# Patient Record
Sex: Male | Born: 1960 | Race: Black or African American | Hispanic: No | Smoking: Never smoker
Health system: Southern US, Community
[De-identification: ages and names within clinical notes are randomized; demographics above are authoritative.]

## PROBLEM LIST (undated history)

## (undated) DIAGNOSIS — E119 Type 2 diabetes mellitus without complications: Secondary | ICD-10-CM

## (undated) DIAGNOSIS — Z87442 Personal history of urinary calculi: Secondary | ICD-10-CM

## (undated) DIAGNOSIS — J189 Pneumonia, unspecified organism: Secondary | ICD-10-CM

## (undated) DIAGNOSIS — I10 Essential (primary) hypertension: Secondary | ICD-10-CM

## (undated) HISTORY — PX: CERVICAL DISC ARTHROPLASTY: SHX587

---

## 1999-12-17 ENCOUNTER — Emergency Department (HOSPITAL_COMMUNITY): Admission: EM | Admit: 1999-12-17 | Discharge: 1999-12-17 | Payer: Self-pay | Admitting: Emergency Medicine

## 1999-12-17 ENCOUNTER — Encounter: Payer: Self-pay | Admitting: Emergency Medicine

## 2000-09-05 ENCOUNTER — Other Ambulatory Visit: Admission: RE | Admit: 2000-09-05 | Discharge: 2000-09-05 | Payer: Self-pay | Admitting: Urology

## 2002-01-13 ENCOUNTER — Emergency Department (HOSPITAL_COMMUNITY): Admission: EM | Admit: 2002-01-13 | Discharge: 2002-01-13 | Payer: Self-pay | Admitting: Emergency Medicine

## 2002-01-13 ENCOUNTER — Encounter: Payer: Self-pay | Admitting: Emergency Medicine

## 2003-04-06 ENCOUNTER — Emergency Department (HOSPITAL_COMMUNITY): Admission: EM | Admit: 2003-04-06 | Discharge: 2003-04-06 | Payer: Self-pay | Admitting: Emergency Medicine

## 2003-07-22 ENCOUNTER — Ambulatory Visit (HOSPITAL_COMMUNITY): Admission: RE | Admit: 2003-07-22 | Discharge: 2003-07-22 | Payer: Self-pay | Admitting: Gastroenterology

## 2015-10-05 ENCOUNTER — Other Ambulatory Visit: Payer: Self-pay | Admitting: Internal Medicine

## 2015-10-05 ENCOUNTER — Ambulatory Visit
Admission: RE | Admit: 2015-10-05 | Discharge: 2015-10-05 | Disposition: A | Discharge status: Home / Self Care | Payer: 59 | Source: Ambulatory Visit | Attending: Internal Medicine | Admitting: Internal Medicine

## 2015-10-05 DIAGNOSIS — R51 Headache: Principal | ICD-10-CM

## 2015-10-05 DIAGNOSIS — R110 Nausea: Secondary | ICD-10-CM

## 2015-10-05 DIAGNOSIS — R519 Headache, unspecified: Secondary | ICD-10-CM

## 2016-03-22 DIAGNOSIS — E1129 Type 2 diabetes mellitus with other diabetic kidney complication: Secondary | ICD-10-CM | POA: Diagnosis not present

## 2016-03-22 DIAGNOSIS — E7800 Pure hypercholesterolemia, unspecified: Secondary | ICD-10-CM | POA: Diagnosis not present

## 2016-03-22 DIAGNOSIS — N182 Chronic kidney disease, stage 2 (mild): Secondary | ICD-10-CM | POA: Diagnosis not present

## 2016-09-26 DIAGNOSIS — Z0000 Encounter for general adult medical examination without abnormal findings: Secondary | ICD-10-CM | POA: Diagnosis not present

## 2016-10-01 DIAGNOSIS — Z1389 Encounter for screening for other disorder: Secondary | ICD-10-CM | POA: Diagnosis not present

## 2016-10-01 DIAGNOSIS — N182 Chronic kidney disease, stage 2 (mild): Secondary | ICD-10-CM | POA: Diagnosis not present

## 2016-10-01 DIAGNOSIS — Z0000 Encounter for general adult medical examination without abnormal findings: Secondary | ICD-10-CM | POA: Diagnosis not present

## 2016-10-01 DIAGNOSIS — Z23 Encounter for immunization: Secondary | ICD-10-CM | POA: Diagnosis not present

## 2016-10-01 DIAGNOSIS — R808 Other proteinuria: Secondary | ICD-10-CM | POA: Diagnosis not present

## 2016-10-01 DIAGNOSIS — E1129 Type 2 diabetes mellitus with other diabetic kidney complication: Secondary | ICD-10-CM | POA: Diagnosis not present

## 2016-10-04 DIAGNOSIS — Z1212 Encounter for screening for malignant neoplasm of rectum: Secondary | ICD-10-CM | POA: Diagnosis not present

## 2017-08-05 DIAGNOSIS — H524 Presbyopia: Secondary | ICD-10-CM | POA: Diagnosis not present

## 2017-08-05 DIAGNOSIS — H5213 Myopia, bilateral: Secondary | ICD-10-CM | POA: Diagnosis not present

## 2017-09-30 DIAGNOSIS — R82998 Other abnormal findings in urine: Secondary | ICD-10-CM | POA: Diagnosis not present

## 2017-09-30 DIAGNOSIS — Z0000 Encounter for general adult medical examination without abnormal findings: Secondary | ICD-10-CM | POA: Diagnosis not present

## 2017-09-30 DIAGNOSIS — Z125 Encounter for screening for malignant neoplasm of prostate: Secondary | ICD-10-CM | POA: Diagnosis not present

## 2017-10-07 DIAGNOSIS — R808 Other proteinuria: Secondary | ICD-10-CM | POA: Diagnosis not present

## 2017-10-07 DIAGNOSIS — E1129 Type 2 diabetes mellitus with other diabetic kidney complication: Secondary | ICD-10-CM | POA: Diagnosis not present

## 2017-10-07 DIAGNOSIS — N182 Chronic kidney disease, stage 2 (mild): Secondary | ICD-10-CM | POA: Diagnosis not present

## 2017-10-07 DIAGNOSIS — Z1389 Encounter for screening for other disorder: Secondary | ICD-10-CM | POA: Diagnosis not present

## 2017-10-07 DIAGNOSIS — Z0000 Encounter for general adult medical examination without abnormal findings: Secondary | ICD-10-CM | POA: Diagnosis not present

## 2017-12-16 DIAGNOSIS — M542 Cervicalgia: Secondary | ICD-10-CM | POA: Diagnosis not present

## 2017-12-16 DIAGNOSIS — M549 Dorsalgia, unspecified: Secondary | ICD-10-CM | POA: Diagnosis not present

## 2017-12-16 DIAGNOSIS — R079 Chest pain, unspecified: Secondary | ICD-10-CM | POA: Diagnosis not present

## 2017-12-20 DIAGNOSIS — M549 Dorsalgia, unspecified: Secondary | ICD-10-CM | POA: Diagnosis not present

## 2017-12-20 DIAGNOSIS — M542 Cervicalgia: Secondary | ICD-10-CM | POA: Diagnosis not present

## 2017-12-26 DIAGNOSIS — M9902 Segmental and somatic dysfunction of thoracic region: Secondary | ICD-10-CM | POA: Diagnosis not present

## 2017-12-26 DIAGNOSIS — M62830 Muscle spasm of back: Secondary | ICD-10-CM | POA: Diagnosis not present

## 2017-12-26 DIAGNOSIS — M9901 Segmental and somatic dysfunction of cervical region: Secondary | ICD-10-CM | POA: Diagnosis not present

## 2018-01-03 DIAGNOSIS — M542 Cervicalgia: Secondary | ICD-10-CM | POA: Diagnosis not present

## 2018-01-12 ENCOUNTER — Other Ambulatory Visit: Payer: Self-pay | Admitting: Orthopedic Surgery

## 2018-01-12 DIAGNOSIS — M542 Cervicalgia: Secondary | ICD-10-CM

## 2018-01-16 ENCOUNTER — Ambulatory Visit
Admission: RE | Admit: 2018-01-16 | Discharge: 2018-01-16 | Disposition: A | Discharge status: Home / Self Care | Payer: 59 | Source: Ambulatory Visit | Attending: Orthopedic Surgery | Admitting: Orthopedic Surgery

## 2018-01-16 DIAGNOSIS — M4802 Spinal stenosis, cervical region: Secondary | ICD-10-CM | POA: Diagnosis not present

## 2018-01-16 DIAGNOSIS — M542 Cervicalgia: Secondary | ICD-10-CM

## 2018-01-23 DIAGNOSIS — M5010 Cervical disc disorder with radiculopathy, unspecified cervical region: Secondary | ICD-10-CM | POA: Diagnosis not present

## 2018-01-23 DIAGNOSIS — M5020 Other cervical disc displacement, unspecified cervical region: Secondary | ICD-10-CM | POA: Diagnosis not present

## 2018-02-13 DIAGNOSIS — M5010 Cervical disc disorder with radiculopathy, unspecified cervical region: Secondary | ICD-10-CM | POA: Diagnosis not present

## 2018-02-20 DIAGNOSIS — M5013 Cervical disc disorder with radiculopathy, cervicothoracic region: Secondary | ICD-10-CM | POA: Diagnosis not present

## 2018-04-06 DIAGNOSIS — Z4889 Encounter for other specified surgical aftercare: Secondary | ICD-10-CM | POA: Diagnosis not present

## 2018-04-07 DIAGNOSIS — R809 Proteinuria, unspecified: Secondary | ICD-10-CM | POA: Diagnosis not present

## 2018-04-07 DIAGNOSIS — I10 Essential (primary) hypertension: Secondary | ICD-10-CM | POA: Diagnosis not present

## 2018-04-07 DIAGNOSIS — E1129 Type 2 diabetes mellitus with other diabetic kidney complication: Secondary | ICD-10-CM | POA: Diagnosis not present

## 2018-04-07 DIAGNOSIS — E7800 Pure hypercholesterolemia, unspecified: Secondary | ICD-10-CM | POA: Diagnosis not present

## 2018-04-07 DIAGNOSIS — N182 Chronic kidney disease, stage 2 (mild): Secondary | ICD-10-CM | POA: Diagnosis not present

## 2018-04-13 DIAGNOSIS — M542 Cervicalgia: Secondary | ICD-10-CM | POA: Diagnosis not present

## 2018-05-19 DIAGNOSIS — Z4889 Encounter for other specified surgical aftercare: Secondary | ICD-10-CM | POA: Diagnosis not present

## 2018-07-31 ENCOUNTER — Encounter (HOSPITAL_COMMUNITY): Payer: Self-pay

## 2018-07-31 ENCOUNTER — Ambulatory Visit (HOSPITAL_COMMUNITY)
Admission: EM | Admit: 2018-07-31 | Discharge: 2018-07-31 | Disposition: A | Discharge status: Home / Self Care | Payer: 59 | Attending: Family Medicine | Admitting: Family Medicine

## 2018-07-31 ENCOUNTER — Other Ambulatory Visit: Payer: Self-pay

## 2018-07-31 DIAGNOSIS — L723 Sebaceous cyst: Secondary | ICD-10-CM

## 2018-07-31 HISTORY — DX: Type 2 diabetes mellitus without complications: E11.9

## 2018-07-31 HISTORY — DX: Essential (primary) hypertension: I10

## 2018-07-31 MED ORDER — CEPHALEXIN 500 MG PO CAPS: 500.0000 mg | ORAL_CAPSULE | Freq: Two times a day (BID) | ORAL | 0 refills | Status: DC

## 2018-07-31 NOTE — ED Triage Notes (Signed)
Pt presents with the begininng stages of an abscess on the outside of the right ear.

## 2018-07-31 NOTE — Discharge Instructions (Signed)
Please place a warm wash cloth over the area Please take the antibiotics if the area is still painful or becomes red  Please take ibuprofen for pain  Please follow up if your symptoms fail to improve  You can consider seeing an ENT doctor to remove the cyst.

## 2018-07-31 NOTE — ED Provider Notes (Signed)
[facility]    CSN: [id number] Arrival date & time: 07/31/18  1244     History   Chief Complaint Chief Complaint  Patient presents with  . Abscess    HPI Jeremy Moore is a 58 y.o. male.   He is presenting with right ear pain.  He has had this pain before.  He is having some fullness over the preauricular area.  He has had symptoms suggestive of an abscess previously.  He has pain to the touch.  It is significantly painful and constant.  No loss of hearing.  No trauma.  No fever or chills.  HPI  Past Medical History:  Diagnosis Date  . Diabetes mellitus without complication (HCC)   . Hypertension     There are no active problems to display for this patient.   Past Surgical History:  Procedure Laterality Date  . CERVICAL DISC ARTHROPLASTY         Home Medications    Prior to Admission medications   Medication Sig Start Date End Date Taking? Authorizing Provider  cephALEXin (KEFLEX) 500 MG capsule Take 1 capsule (500 mg total) by mouth 2 (two) times daily. 07/31/18   Myra RudeSchmitz, Jeremy E, MD    Family History Family History  Family history unknown: Yes    Social History Social History   Tobacco Use  . Smoking status: Never Smoker  Substance Use Topics  . Alcohol use: Not on file  . Drug use: Not on file     Allergies   Patient has no known allergies.   Review of Systems Review of Systems  Constitutional: Negative for fever.  HENT: Negative for congestion.   Respiratory: Negative for cough.   Cardiovascular: Negative for chest pain.  Gastrointestinal: Negative for abdominal pain.  Musculoskeletal: Negative for neck pain.  Skin: Negative for color change.  Neurological: Negative for weakness.  Hematological: Negative for adenopathy.     Physical Exam Triage Vital Signs ED Triage Vitals [07/31/18 1314]  Enc Vitals Group     BP (!) 136/91     Pulse Rate 69     Resp 18     Temp 98.4 F (36.9 C)     Temp Source Oral     SpO2 97  %     Weight      Height      Head Circumference      Peak Flow      Pain Score 5     Pain Loc      Pain Edu?      Excl. in GC?    No data found.  Updated Vital Signs BP (!) 136/91 (BP Location: Right Arm)   Pulse 69   Temp 98.4 F (36.9 C) (Oral)   Resp 18   SpO2 97%   Visual Acuity Right Eye Distance:   Left Eye Distance:   Bilateral Distance:    Right Eye Near:   Left Eye Near:    Bilateral Near:     Physical Exam Gen: NAD, alert, cooperative with exam, well-appearing ENT: normal lips, normal nasal mucosa,  Eye: normal EOM, normal conjunctiva and lids CV:  no edema, +2 pedal pulses   Resp: no accessory muscle use, non-labored,   Skin: Area of induration and fluctuance occurring over the preauricular area on the right ear.  Swelling of this area.  Significantly tender to the touch.  Induration  Neuro: normal tone, normal sensation to touch Psych:  normal insight, alert and oriented MSK:  Normal gait, normal strength  Incision and Drainage Procedure Note:  The affected area was cleaned and draped in a sterile fashion. Anesthesia was achieved using 3 mL of 2% Lidocaine without epinephrine injected around the wound area using a 25-guage 1.5 inch needle.  An 18-gauge inch and a half needle was inserted into the area with a 10 cc syringe.  Aspiration was achieved.  Expression of further material was achieved. The patient tolerated the procedure well. No complications were encountered.    UC Treatments / Results  Labs (all labs ordered are listed, but only abnormal results are displayed) Labs Reviewed - No data to display  EKG   Radiology No results found.  Procedures Procedures (including critical care time)  Medications Ordered in UC Medications - No data to display  Initial Impression / Assessment and Plan / UC Course  I have reviewed the triage vital signs and the nursing notes.  Pertinent labs & imaging results that were available during my care of  the patient were reviewed by me and considered in my medical decision making (see chart for details).     Jeremy Moore is a 58 year old male that is presenting with a sebaceous cyst over the right preauricular ear.  An incision and drainage was completed today.  Expression of pus was achieved.  He was provided Keflex if the area does not improve.  He was counseled on supportive care.  He was given indications to return to follow-up.  Final Clinical Impressions(s) / UC Diagnoses   Final diagnoses:  Sebaceous cyst     Discharge Instructions     Please place a warm wash cloth over the area Please take the antibiotics if the area is still painful or becomes red  Please take ibuprofen for pain  Please follow up if your symptoms fail to improve  You can consider seeing an ENT doctor to remove the cyst.     ED Prescriptions    Medication Sig Dispense Auth. Provider   cephALEXin (KEFLEX) 500 MG capsule Take 1 capsule (500 mg total) by mouth 2 (two) times daily. 14 capsule Rosemarie Ax, MD     Controlled Substance Prescriptions [place] Controlled Substance Registry consulted? Not Applicable   Rosemarie Ax, MD 07/31/18 [phone]

## 2019-12-13 ENCOUNTER — Other Ambulatory Visit: Payer: Self-pay | Admitting: Physician Assistant

## 2019-12-13 DIAGNOSIS — R1032 Left lower quadrant pain: Secondary | ICD-10-CM

## 2019-12-13 DIAGNOSIS — K5730 Diverticulosis of large intestine without perforation or abscess without bleeding: Secondary | ICD-10-CM

## 2019-12-31 ENCOUNTER — Ambulatory Visit
Admission: RE | Admit: 2019-12-31 | Discharge: 2019-12-31 | Disposition: A | Discharge status: Home / Self Care | Payer: 59 | Source: Ambulatory Visit | Attending: Physician Assistant | Admitting: Physician Assistant

## 2019-12-31 DIAGNOSIS — R1032 Left lower quadrant pain: Secondary | ICD-10-CM

## 2019-12-31 DIAGNOSIS — K5730 Diverticulosis of large intestine without perforation or abscess without bleeding: Secondary | ICD-10-CM

## 2019-12-31 MED ORDER — IOPAMIDOL (ISOVUE-300) INJECTION 61%
100.0000 mL | Freq: Once | INTRAVENOUS | Status: AC | PRN
  Administered 2019-12-31: 100 mL via INTRAVENOUS

## 2020-01-03 ENCOUNTER — Other Ambulatory Visit: Payer: Self-pay | Admitting: Physician Assistant

## 2020-01-03 DIAGNOSIS — N289 Disorder of kidney and ureter, unspecified: Secondary | ICD-10-CM

## 2020-01-28 ENCOUNTER — Other Ambulatory Visit: Payer: 59

## 2020-03-01 ENCOUNTER — Other Ambulatory Visit: Payer: Self-pay | Admitting: Urology

## 2020-03-02 ENCOUNTER — Other Ambulatory Visit (HOSPITAL_COMMUNITY): Payer: Self-pay | Admitting: Urology

## 2020-03-02 DIAGNOSIS — N200 Calculus of kidney: Secondary | ICD-10-CM

## 2020-03-20 NOTE — Progress Notes (Signed)
DUE TO COVID-19 ONLY ONE VISITOR IS ALLOWED TO COME WITH YOU AND STAY IN THE WAITING ROOM ONLY DURING PRE OP AND PROCEDURE DAY OF SURGERY. THE 1 VISITOR  MAY VISIT WITH YOU AFTER SURGERY IN YOUR PRIVATE ROOM DURING VISITING HOURS ONLY!  YOU NEED TO HAVE A COVID 19 TEST ON_3/08/2020 ______ @_______ , THIS TEST MUST BE DONE BEFORE SURGERY,  COVID TESTING SITE [address] [place] , IT IS ON THE RIGHT GOING OUT [address] APPROXIMATELY  2 MINUTES PAST [employer] ON THE RIGHT. ONCE YOUR COVID TEST IS COMPLETED,  PLEASE BEGIN THE QUARANTINE INSTRUCTIONS AS OUTLINED IN YOUR HANDOUT.                DANUEL FELICETTI  03/20/2020   Your procedure is scheduled on: 03/31/2020    Report to [facility] Main  Entrance   Report to admitting at   0800 AM     Call this number if you have problems the morning of surgery [phone]    Remember: Do not eat food , candy gum or mints :After Midnight. You may have clear liquids from midnight until   0630am     CLEAR LIQUID DIET   Foods Allowed                                                                       Coffee and tea, regular and decaf                              Plain Jell-O any favor except red or purple                                            Fruit ices (not with fruit pulp)                                      Iced Popsicles                                     Carbonated beverages, regular and diet                                    Cranberry, grape and apple juices Sports drinks like Gatorade Lightly seasoned clear broth or consume(fat free) Sugar, honey syrup   _____________________________________________________________________    BRUSH YOUR TEETH MORNING OF SURGERY AND RINSE YOUR MOUTH OUT, NO CHEWING GUM CANDY OR MINTS.     Take these medicines the morning of surgery with A SIP OF WATER: none  NO jardiance day before surgery  DO NOT TAKE ANY DIABETIC MEDICATIONS DAY OF YOUR  SURGERY                               You may  not have any metal on your body including hair pins and              piercings  Do not wear jewelry, make-up, lotions, powders or perfumes, deodorant             Do not wear nail polish on your fingernails.  Do not shave  48 hours prior to surgery.              Men may shave face and neck.   Do not bring valuables to the hospital. [place].  Contacts, dentures or bridgework may not be worn into surgery.  Leave suitcase in the car. After surgery it may be brought to your room.     Patients discharged the day of surgery will not be allowed to drive home. IF YOU ARE HAVING SURGERY AND GOING HOME THE SAME DAY, YOU MUST HAVE AN ADULT TO DRIVE YOU HOME AND BE WITH YOU FOR 24 HOURS. YOU MAY GO HOME BY TAXI OR UBER OR ORTHERWISE, BUT AN ADULT MUST ACCOMPANY YOU HOME AND STAY WITH YOU FOR 24 HOURS.  Name and phone number of your driver:  Special Instructions: N/A              Please read over the following fact sheets you were given: _____________________________________________________________________  [facility] - Preparing for Surgery Before surgery, you can play an important role.  Because skin is not sterile, your skin needs to be as free of germs as possible.  You can reduce the number of germs on your skin by washing with CHG (chlorahexidine gluconate) soap before surgery.  CHG is an antiseptic cleaner which kills germs and bonds with the skin to continue killing germs even after washing. Please DO NOT use if you have an allergy to CHG or antibacterial soaps.  If your skin becomes reddened/irritated stop using the CHG and inform your nurse when you arrive at Short Stay. Do not shave (including legs and underarms) for at least 48 hours prior to the first CHG shower.  You may shave your face/neck. Please follow these instructions carefully:  1.  Shower with CHG Soap the night before surgery and the   morning of Surgery.  2.  If you choose to wash your hair, wash your hair first as usual with your  normal  shampoo.  3.  After you shampoo, rinse your hair and body thoroughly to remove the  shampoo.                           4.  Use CHG as you would any other liquid soap.  You can apply chg directly  to the skin and wash                       Gently with a scrungie or clean washcloth.  5.  Apply the CHG Soap to your body ONLY FROM THE NECK DOWN.   Do not use on face/ open                           Wound or open sores. Avoid contact with eyes, ears mouth and genitals (private parts).  Wash face,  Genitals (private parts) with your normal soap.             6.  Wash thoroughly, paying special attention to the area where your surgery  will be performed.  7.  Thoroughly rinse your body with warm water from the neck down.  8.  DO NOT shower/wash with your normal soap after using and rinsing off  the CHG Soap.                9.  Pat yourself dry with a clean towel.            10.  Wear clean pajamas.            11.  Place clean sheets on your bed the night of your first shower and do not  sleep with pets. Day of Surgery : Do not apply any lotions/deodorants the morning of surgery.  Please wear clean clothes to the hospital/surgery center.  FAILURE TO FOLLOW THESE INSTRUCTIONS MAY RESULT IN THE CANCELLATION OF YOUR SURGERY PATIENT SIGNATURE_________________________________  NURSE SIGNATURE__________________________________  ________________________________________________________________________

## 2020-03-22 ENCOUNTER — Encounter (HOSPITAL_COMMUNITY)
Admission: RE | Admit: 2020-03-22 | Discharge: 2020-03-22 | Disposition: A | Discharge status: Home / Self Care | Payer: 59 | Source: Ambulatory Visit | Attending: Urology | Admitting: Urology

## 2020-03-22 ENCOUNTER — Other Ambulatory Visit: Payer: Self-pay

## 2020-03-22 ENCOUNTER — Encounter (HOSPITAL_COMMUNITY): Payer: Self-pay

## 2020-03-22 DIAGNOSIS — Z01818 Encounter for other preprocedural examination: Secondary | ICD-10-CM | POA: Insufficient documentation

## 2020-03-22 HISTORY — DX: Personal history of urinary calculi: Z87.442

## 2020-03-22 HISTORY — DX: Pneumonia, unspecified organism: J18.9

## 2020-03-22 LAB — BASIC METABOLIC PANEL
Anion gap: 11 (ref 5–15)
BUN: 34 mg/dL — ABNORMAL HIGH (ref 8–23)
CO2: 25 mmol/L (ref 22–32)
Calcium: 10.3 mg/dL (ref 8.9–10.3)
Chloride: 102 mmol/L (ref 98–111)
Creatinine, Ser: 0.99 mg/dL (ref 0.61–1.24)
GFR, Estimated: >60 mL/min (ref >60)
Glucose, Bld: 216 mg/dL — ABNORMAL HIGH (ref 70–99)
Potassium: 4.5 mmol/L (ref 3.5–5.1)
Sodium: 138 mmol/L (ref 135–145)

## 2020-03-22 LAB — CBC
HCT: 41.6 % (ref 39.0–52.0)
Hemoglobin: 13.3 g/dL (ref 13.0–17.0)
MCH: 30 pg (ref 26.0–34.0)
MCHC: 32 g/dL (ref 30.0–36.0)
MCV: 93.7 fL (ref 80.0–100.0)
Platelets: 272 10*3/uL (ref 150–400)
RBC: 4.44 MIL/uL (ref 4.22–5.81)
RDW: 13.1 % (ref 11.5–15.5)
WBC: 6 10*3/uL (ref 4.0–10.5)
nRBC: 0 % (ref 0.0–0.2)

## 2020-03-22 LAB — HEMOGLOBIN A1C
Hgb A1c MFr Bld: 7.2 % — ABNORMAL HIGH (ref 4.8–5.6)
Mean Plasma Glucose: 159.94 mg/dL

## 2020-03-22 LAB — GLUCOSE, CAPILLARY: Glucose-Capillary: 204 mg/dL — ABNORMAL HIGH (ref 70–99)

## 2020-03-22 NOTE — Progress Notes (Signed)
HGBA1c-done 03/22/20-

## 2020-03-22 NOTE — Progress Notes (Signed)
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

## 2020-03-23 NOTE — Progress Notes (Signed)
hgba1c-7.2 on 03/22/20.

## 2020-03-28 ENCOUNTER — Other Ambulatory Visit (HOSPITAL_COMMUNITY)
Admission: RE | Admit: 2020-03-28 | Discharge: 2020-03-28 | Disposition: A | Discharge status: Home / Self Care | Payer: 59 | Source: Ambulatory Visit | Attending: Urology | Admitting: Urology

## 2020-03-28 DIAGNOSIS — Z01812 Encounter for preprocedural laboratory examination: Secondary | ICD-10-CM | POA: Insufficient documentation

## 2020-03-28 DIAGNOSIS — Z20822 Contact with and (suspected) exposure to covid-19: Secondary | ICD-10-CM | POA: Insufficient documentation

## 2020-03-28 LAB — SARS CORONAVIRUS 2 (TAT 6-24 HRS): SARS Coronavirus 2: NEGATIVE

## 2020-03-30 ENCOUNTER — Other Ambulatory Visit: Payer: Self-pay | Admitting: Radiology

## 2020-03-31 ENCOUNTER — Other Ambulatory Visit: Payer: Self-pay

## 2020-03-31 ENCOUNTER — Ambulatory Visit (HOSPITAL_COMMUNITY): Payer: 59 | Admitting: Anesthesiology

## 2020-03-31 ENCOUNTER — Encounter (HOSPITAL_COMMUNITY): Payer: Self-pay | Admitting: Urology

## 2020-03-31 ENCOUNTER — Encounter (HOSPITAL_COMMUNITY)
Admission: RE | Disposition: A | Discharge status: Home / Self Care | Payer: Self-pay | Source: Ambulatory Visit | Attending: Urology

## 2020-03-31 ENCOUNTER — Ambulatory Visit (HOSPITAL_COMMUNITY)
Admission: RE | Admit: 2020-03-31 | Discharge: 2020-03-31 | Disposition: A | Discharge status: Home / Self Care | Payer: 59 | Source: Ambulatory Visit | Attending: Urology | Admitting: Urology

## 2020-03-31 ENCOUNTER — Observation Stay (HOSPITAL_COMMUNITY)
Admission: RE | Admit: 2020-03-31 | Discharge: 2020-04-02 | Disposition: A | Discharge status: Home / Self Care | Payer: 59 | Source: Ambulatory Visit | Attending: Urology | Admitting: Urology

## 2020-03-31 ENCOUNTER — Ambulatory Visit (HOSPITAL_COMMUNITY): Payer: 59

## 2020-03-31 DIAGNOSIS — Z7984 Long term (current) use of oral hypoglycemic drugs: Secondary | ICD-10-CM | POA: Insufficient documentation

## 2020-03-31 DIAGNOSIS — E119 Type 2 diabetes mellitus without complications: Secondary | ICD-10-CM | POA: Diagnosis not present

## 2020-03-31 DIAGNOSIS — Z79899 Other long term (current) drug therapy: Secondary | ICD-10-CM | POA: Insufficient documentation

## 2020-03-31 DIAGNOSIS — N200 Calculus of kidney: Secondary | ICD-10-CM | POA: Diagnosis not present

## 2020-03-31 DIAGNOSIS — I10 Essential (primary) hypertension: Secondary | ICD-10-CM | POA: Diagnosis not present

## 2020-03-31 HISTORY — PX: IR URETERAL STENT LEFT NEW ACCESS W/O SEP NEPHROSTOMY CATH: IMG6075

## 2020-03-31 HISTORY — PX: NEPHROLITHOTOMY: SHX5134

## 2020-03-31 LAB — CBC WITH DIFFERENTIAL/PLATELET
Abs Immature Granulocytes: 0.03 10*3/uL (ref 0.00–0.07)
Basophils Absolute: 0 10*3/uL (ref 0.0–0.1)
Basophils Relative: 1 %
Eosinophils Absolute: 0.2 10*3/uL (ref 0.0–0.5)
Eosinophils Relative: 4 %
HCT: 44.2 % (ref 39.0–52.0)
Hemoglobin: 13.8 g/dL (ref 13.0–17.0)
Immature Granulocytes: 1 %
Lymphocytes Relative: 34 %
Lymphs Abs: 1.6 10*3/uL (ref 0.7–4.0)
MCH: 28.8 pg (ref 26.0–34.0)
MCHC: 31.2 g/dL (ref 30.0–36.0)
MCV: 92.1 fL (ref 80.0–100.0)
Monocytes Absolute: 0.4 10*3/uL (ref 0.1–1.0)
Monocytes Relative: 9 %
Neutro Abs: 2.4 10*3/uL (ref 1.7–7.7)
Neutrophils Relative %: 51 %
Platelets: 251 10*3/uL (ref 150–400)
RBC: 4.8 MIL/uL (ref 4.22–5.81)
RDW: 12.6 % (ref 11.5–15.5)
WBC: 4.7 10*3/uL (ref 4.0–10.5)
nRBC: 0 % (ref 0.0–0.2)

## 2020-03-31 LAB — GLUCOSE, CAPILLARY
Glucose-Capillary: 186 mg/dL — ABNORMAL HIGH (ref 70–99)
Glucose-Capillary: 120 mg/dL — ABNORMAL HIGH (ref 70–99)
Glucose-Capillary: 329 mg/dL — ABNORMAL HIGH (ref 70–99)

## 2020-03-31 LAB — CBC
HCT: 40.6 % (ref 39.0–52.0)
Hemoglobin: 12.7 g/dL — ABNORMAL LOW (ref 13.0–17.0)
MCH: 28.9 pg (ref 26.0–34.0)
MCHC: 31.3 g/dL (ref 30.0–36.0)
MCV: 92.5 fL (ref 80.0–100.0)
Platelets: 247 10*3/uL (ref 150–400)
RBC: 4.39 MIL/uL (ref 4.22–5.81)
RDW: 12.7 % (ref 11.5–15.5)
WBC: 8.7 10*3/uL (ref 4.0–10.5)
nRBC: 0 % (ref 0.0–0.2)

## 2020-03-31 LAB — BASIC METABOLIC PANEL
Anion gap: 9 (ref 5–15)
Anion gap: 11 (ref 5–15)
BUN: 23 mg/dL (ref 8–23)
BUN: 23 mg/dL (ref 8–23)
CO2: 25 mmol/L (ref 22–32)
CO2: 25 mmol/L (ref 22–32)
Calcium: 9.5 mg/dL (ref 8.9–10.3)
Calcium: 9.2 mg/dL (ref 8.9–10.3)
Chloride: 104 mmol/L (ref 98–111)
Chloride: 98 mmol/L (ref 98–111)
Creatinine, Ser: 1 mg/dL (ref 0.61–1.24)
Creatinine, Ser: 1.47 mg/dL — ABNORMAL HIGH (ref 0.61–1.24)
GFR, Estimated: >60 mL/min (ref >60)
GFR, Estimated: 54 mL/min — ABNORMAL LOW (ref >60)
Glucose, Bld: 190 mg/dL — ABNORMAL HIGH (ref 70–99)
Glucose, Bld: 323 mg/dL — ABNORMAL HIGH (ref 70–99)
Potassium: 4.1 mmol/L (ref 3.5–5.1)
Potassium: 4.7 mmol/L (ref 3.5–5.1)
Sodium: 138 mmol/L (ref 135–145)
Sodium: 134 mmol/L — ABNORMAL LOW (ref 135–145)

## 2020-03-31 LAB — ABO/RH: ABO/RH(D): O POS

## 2020-03-31 LAB — PROTIME-INR
INR: 1.1 (ref 0.8–1.2)
Prothrombin Time: 13.6 seconds (ref 11.4–15.2)

## 2020-03-31 SURGERY — NEPHROLITHOTOMY PERCUTANEOUS
Anesthesia: General | Laterality: Left

## 2020-03-31 MED ORDER — OXYCODONE HCL 5 MG PO TABS
5.0000 mg | ORAL_TABLET | ORAL | Status: DC | PRN
Start: 2020-03-31 — End: 2020-04-02
  Administered 2020-04-01 – 2020-04-02 (×4): 5 mg via ORAL
  Filled 2020-03-31 (×4): qty 1

## 2020-03-31 MED ORDER — IOHEXOL 300 MG/ML  SOLN
INTRAMUSCULAR | Status: DC | PRN
  Administered 2020-03-31: 10 mL

## 2020-03-31 MED ORDER — DEXAMETHASONE SODIUM PHOSPHATE 10 MG/ML IJ SOLN
INTRAMUSCULAR | Status: AC
  Filled 2020-03-31: qty 1

## 2020-03-31 MED ORDER — MIDAZOLAM HCL 2 MG/2ML IJ SOLN
INTRAMUSCULAR | Status: AC
  Filled 2020-03-31: qty 4

## 2020-03-31 MED ORDER — SODIUM CHLORIDE 0.9 % IR SOLN
Status: DC | PRN
  Administered 2020-03-31: 12000 mL

## 2020-03-31 MED ORDER — LIDOCAINE HCL 1 % IJ SOLN
INTRAMUSCULAR | Status: AC
  Filled 2020-03-31: qty 20

## 2020-03-31 MED ORDER — AMLODIPINE BESYLATE 5 MG PO TABS
5.0000 mg | ORAL_TABLET | Freq: Every day | ORAL | Status: DC
  Administered 2020-04-01 – 2020-04-02 (×2): 5 mg via ORAL
  Filled 2020-03-31 (×2): qty 1

## 2020-03-31 MED ORDER — CHLORHEXIDINE GLUCONATE 0.12 % MT SOLN
15.0000 mL | Freq: Once | OROMUCOSAL | Status: AC
  Administered 2020-03-31: 15 mL via OROMUCOSAL

## 2020-03-31 MED ORDER — LIDOCAINE HCL (PF) 1 % IJ SOLN
INTRAMUSCULAR | Status: AC | PRN
  Administered 2020-03-31: 10 mL

## 2020-03-31 MED ORDER — CHLORHEXIDINE GLUCONATE CLOTH 2 % EX PADS
6.0000 | MEDICATED_PAD | Freq: Every day | CUTANEOUS | Status: DC
  Administered 2020-04-01 – 2020-04-02 (×2): 6 via TOPICAL

## 2020-03-31 MED ORDER — FENTANYL CITRATE (PF) 100 MCG/2ML IJ SOLN
INTRAMUSCULAR | Status: AC
  Filled 2020-03-31: qty 2

## 2020-03-31 MED ORDER — ONDANSETRON HCL 4 MG/2ML IJ SOLN
INTRAMUSCULAR | Status: DC | PRN
  Administered 2020-03-31: 4 mg via INTRAVENOUS

## 2020-03-31 MED ORDER — ROCURONIUM BROMIDE 10 MG/ML (PF) SYRINGE
PREFILLED_SYRINGE | INTRAVENOUS | Status: AC
  Filled 2020-03-31: qty 10

## 2020-03-31 MED ORDER — MIDAZOLAM HCL 2 MG/2ML IJ SOLN
INTRAMUSCULAR | Status: AC | PRN
  Administered 2020-03-31: 2 mg via INTRAVENOUS
  Administered 2020-03-31 (×2): 1 mg via INTRAVENOUS

## 2020-03-31 MED ORDER — PROPOFOL 10 MG/ML IV BOLUS
INTRAVENOUS | Status: AC
  Filled 2020-03-31: qty 20

## 2020-03-31 MED ORDER — INSULIN ASPART 100 UNIT/ML ~~LOC~~ SOLN: 0.0000 [IU] | Freq: Three times a day (TID) | SUBCUTANEOUS | Status: DC

## 2020-03-31 MED ORDER — CEFAZOLIN SODIUM-DEXTROSE 2-4 GM/100ML-% IV SOLN: 2.0000 g | Freq: Once | INTRAVENOUS | Status: DC

## 2020-03-31 MED ORDER — ONDANSETRON HCL 4 MG/2ML IJ SOLN
4.0000 mg | INTRAMUSCULAR | Status: DC | PRN
  Administered 2020-04-01 – 2020-04-02 (×2): 4 mg via INTRAVENOUS
  Filled 2020-03-31 (×2): qty 2

## 2020-03-31 MED ORDER — LACTATED RINGERS IV SOLN: INTRAVENOUS | Status: DC

## 2020-03-31 MED ORDER — FENTANYL CITRATE (PF) 100 MCG/2ML IJ SOLN
INTRAMUSCULAR | Status: AC | PRN
  Administered 2020-03-31 (×2): 50 ug via INTRAVENOUS

## 2020-03-31 MED ORDER — ONDANSETRON HCL 4 MG/2ML IJ SOLN
INTRAMUSCULAR | Status: AC
  Filled 2020-03-31: qty 2

## 2020-03-31 MED ORDER — LIDOCAINE 2% (20 MG/ML) 5 ML SYRINGE
INTRAMUSCULAR | Status: DC | PRN
  Administered 2020-03-31: 60 mg via INTRAVENOUS

## 2020-03-31 MED ORDER — ACETAMINOPHEN 10 MG/ML IV SOLN
1000.0000 mg | Freq: Once | INTRAVENOUS | Status: DC | PRN
Start: 2020-03-31 — End: 2020-03-31

## 2020-03-31 MED ORDER — ORAL CARE MOUTH RINSE: 15.0000 mL | Freq: Once | OROMUCOSAL | Status: AC

## 2020-03-31 MED ORDER — FENTANYL CITRATE (PF) 100 MCG/2ML IJ SOLN
INTRAMUSCULAR | Status: DC | PRN
  Administered 2020-03-31 (×3): 50 ug via INTRAVENOUS

## 2020-03-31 MED ORDER — IOHEXOL 300 MG/ML  SOLN
50.0000 mL | Freq: Once | INTRAMUSCULAR | Status: AC | PRN
  Administered 2020-03-31: 10 mL

## 2020-03-31 MED ORDER — SENNA 8.6 MG PO TABS
1.0000 | ORAL_TABLET | Freq: Two times a day (BID) | ORAL | Status: DC
  Administered 2020-03-31 – 2020-04-02 (×4): 8.6 mg via ORAL
  Filled 2020-03-31 (×4): qty 1

## 2020-03-31 MED ORDER — SODIUM CHLORIDE 0.9 % IV SOLN
INTRAVENOUS | Status: AC
  Filled 2020-03-31: qty 2

## 2020-03-31 MED ORDER — SODIUM CHLORIDE 0.9 % IV SOLN: 2.0000 g | Freq: Once | INTRAVENOUS | Status: DC

## 2020-03-31 MED ORDER — INSULIN ASPART 100 UNIT/ML ~~LOC~~ SOLN
0.0000 [IU] | Freq: Every day | SUBCUTANEOUS | Status: DC
  Administered 2020-03-31: 4 [IU] via SUBCUTANEOUS

## 2020-03-31 MED ORDER — DOCUSATE SODIUM 100 MG PO CAPS
100.0000 mg | ORAL_CAPSULE | Freq: Two times a day (BID) | ORAL | Status: DC
  Administered 2020-03-31 – 2020-04-02 (×4): 100 mg via ORAL
  Filled 2020-03-31 (×4): qty 1

## 2020-03-31 MED ORDER — DEXAMETHASONE SODIUM PHOSPHATE 10 MG/ML IJ SOLN
INTRAMUSCULAR | Status: DC | PRN
  Administered 2020-03-31: 8 mg via INTRAVENOUS

## 2020-03-31 MED ORDER — PROMETHAZINE HCL 25 MG/ML IJ SOLN: 6.2500 mg | INTRAMUSCULAR | Status: DC | PRN

## 2020-03-31 MED ORDER — ACETAMINOPHEN 500 MG PO TABS
1000.0000 mg | ORAL_TABLET | Freq: Four times a day (QID) | ORAL | Status: DC
  Administered 2020-03-31 – 2020-04-01 (×4): 1000 mg via ORAL
  Filled 2020-03-31 (×5): qty 2

## 2020-03-31 MED ORDER — SODIUM CHLORIDE 0.9 % IV SOLN
INTRAVENOUS | Status: AC
  Filled 2020-03-31: qty 20

## 2020-03-31 MED ORDER — INSULIN ASPART 100 UNIT/ML ~~LOC~~ SOLN
0.0000 [IU] | Freq: Three times a day (TID) | SUBCUTANEOUS | Status: DC
  Administered 2020-04-01: 5 [IU] via SUBCUTANEOUS
  Administered 2020-04-01: 11 [IU] via SUBCUTANEOUS
  Administered 2020-04-01: 5 [IU] via SUBCUTANEOUS
  Administered 2020-04-02: 3 [IU] via SUBCUTANEOUS

## 2020-03-31 MED ORDER — ROCURONIUM BROMIDE 10 MG/ML (PF) SYRINGE
PREFILLED_SYRINGE | INTRAVENOUS | Status: DC | PRN
  Administered 2020-03-31: 60 mg via INTRAVENOUS

## 2020-03-31 MED ORDER — INSULIN ASPART 100 UNIT/ML ~~LOC~~ SOLN: 0.0000 [IU] | Freq: Every day | SUBCUTANEOUS | Status: DC

## 2020-03-31 MED ORDER — HYDROMORPHONE HCL 1 MG/ML IJ SOLN: 0.5000 mg | INTRAMUSCULAR | Status: DC | PRN

## 2020-03-31 MED ORDER — OXYCODONE HCL 5 MG/5ML PO SOLN: 5.0000 mg | Freq: Once | ORAL | Status: DC | PRN

## 2020-03-31 MED ORDER — SUGAMMADEX SODIUM 200 MG/2ML IV SOLN
INTRAVENOUS | Status: DC | PRN
  Administered 2020-03-31: 200 mg via INTRAVENOUS

## 2020-03-31 MED ORDER — LIDOCAINE 2% (20 MG/ML) 5 ML SYRINGE
INTRAMUSCULAR | Status: AC
  Filled 2020-03-31: qty 5

## 2020-03-31 MED ORDER — PROPOFOL 10 MG/ML IV BOLUS
INTRAVENOUS | Status: DC | PRN
  Administered 2020-03-31: 200 mg via INTRAVENOUS

## 2020-03-31 MED ORDER — SODIUM CHLORIDE 0.9 % IV SOLN
2.0000 g | Freq: Once | INTRAVENOUS | Status: AC
  Administered 2020-03-31: 2 g via INTRAVENOUS

## 2020-03-31 MED ORDER — FENTANYL CITRATE (PF) 100 MCG/2ML IJ SOLN: 25.0000 ug | INTRAMUSCULAR | Status: DC | PRN

## 2020-03-31 MED ORDER — SODIUM CHLORIDE 0.45 % IV SOLN: INTRAVENOUS | Status: DC

## 2020-03-31 MED ORDER — OXYCODONE HCL 5 MG PO TABS: 5.0000 mg | ORAL_TABLET | Freq: Once | ORAL | Status: DC | PRN

## 2020-03-31 MED ORDER — CEFAZOLIN SODIUM-DEXTROSE 2-4 GM/100ML-% IV SOLN
2.0000 g | Freq: Three times a day (TID) | INTRAVENOUS | Status: DC
  Administered 2020-03-31 – 2020-04-02 (×5): 2 g via INTRAVENOUS
  Filled 2020-03-31 (×6): qty 100

## 2020-03-31 MED ORDER — KETOROLAC TROMETHAMINE 30 MG/ML IJ SOLN: 30.0000 mg | Freq: Once | INTRAMUSCULAR | Status: DC

## 2020-03-31 SURGICAL SUPPLY — 54 items
BAG URINE DRAIN 2000ML AR STRL (UROLOGICAL SUPPLIES) IMPLANT
BASKET STONE NITINOL 3FRX115MB (UROLOGICAL SUPPLIES) IMPLANT
BASKET ZERO TIP NITINOL 2.4FR (BASKET) ×2 IMPLANT
BENZOIN TINCTURE PRP APPL 2/3 (GAUZE/BANDAGES/DRESSINGS) ×2 IMPLANT
CATH FOLEY 2W COUNCIL 20FR 5CC (CATHETERS) IMPLANT
CATH FOLEY 2W COUNCIL 5CC 18FR (CATHETERS) ×2 IMPLANT
CATH FOLEY 2WAY SLVR  5CC 18FR (CATHETERS) ×2
CATH FOLEY 2WAY SLVR 5CC 18FR (CATHETERS) ×1 IMPLANT
CATH INTERMIT  6FR 70CM (CATHETERS) ×2 IMPLANT
CATH ROBINSON RED A/P 20FR (CATHETERS) IMPLANT
CATH URET FLEX-TIP 2 LUMEN 10F (CATHETERS) ×2 IMPLANT
CATH X-FORCE N30 NEPHROSTOMY (TUBING) ×2 IMPLANT
CHLORAPREP W/TINT 26 (MISCELLANEOUS) ×2 IMPLANT
COVER SURGICAL LIGHT HANDLE (MISCELLANEOUS) ×2 IMPLANT
COVER WAND RF STERILE (DRAPES) IMPLANT
DRAPE C-ARM 42X120 X-RAY (DRAPES) ×2 IMPLANT
DRAPE LINGEMAN PERC (DRAPES) ×2 IMPLANT
DRAPE SURG IRRIG POUCH 19X23 (DRAPES) ×2 IMPLANT
DRAPE UTILITY XL STRL (DRAPES) IMPLANT
DRSG PAD ABDOMINAL 8X10 ST (GAUZE/BANDAGES/DRESSINGS) ×2 IMPLANT
DRSG TEGADERM 8X12 (GAUZE/BANDAGES/DRESSINGS) ×2 IMPLANT
GAUZE SPONGE 4X4 12PLY STRL (GAUZE/BANDAGES/DRESSINGS) ×2 IMPLANT
GLOVE SURG ENC TEXT LTX SZ7.5 (GLOVE) ×2 IMPLANT
GOWN STRL REUS W/TWL XL LVL3 (GOWN DISPOSABLE) ×2 IMPLANT
GUIDEWIRE AMPLAZ .035X145 (WIRE) ×2 IMPLANT
GUIDEWIRE ANG ZIPWIRE 038X150 (WIRE) IMPLANT
KIT BASIN OR (CUSTOM PROCEDURE TRAY) ×2 IMPLANT
KIT PROBE 340X3.4XDISP GRN (MISCELLANEOUS) IMPLANT
KIT PROBE TRILOGY 3.4X340 (MISCELLANEOUS)
KIT PROBE TRILOGY 3.9X350 (MISCELLANEOUS) ×2 IMPLANT
KIT TURNOVER KIT A (KITS) ×2 IMPLANT
LASER FIB FLEXIVA PULSE ID 365 (Laser) IMPLANT
LASER FIB FLEXIVA PULSE ID 550 (Laser) IMPLANT
LASER FIB FLEXIVA PULSE ID 910 (Laser) IMPLANT
MANIFOLD NEPTUNE II (INSTRUMENTS) ×2 IMPLANT
NEEDLE TROCAR 18X15 ECHO (NEEDLE) IMPLANT
NS IRRIG 1000ML POUR BTL (IV SOLUTION) ×2 IMPLANT
PACK CYSTO (CUSTOM PROCEDURE TRAY) ×2 IMPLANT
SHEATH PEELAWAY SET 9 (SHEATH) IMPLANT
SPONGE LAP 4X18 RFD (DISPOSABLE) ×2 IMPLANT
STENT ENDOURETEROTOMY 7-14 26C (STENTS) IMPLANT
SUT SILK 2 0 30  PSL (SUTURE) ×2
SUT SILK 2 0 30 PSL (SUTURE) ×1 IMPLANT
SYR 10ML LL (SYRINGE) ×2 IMPLANT
SYR 20ML LL LF (SYRINGE) ×4 IMPLANT
TOWEL OR 17X26 10 PK STRL BLUE (TOWEL DISPOSABLE) ×2 IMPLANT
TOWEL OR NON WOVEN STRL DISP B (DISPOSABLE) ×2 IMPLANT
TRACTIP FLEXIVA PULS ID 200XHI (Laser) IMPLANT
TRACTIP FLEXIVA PULSE ID 200 (Laser)
TRAY FOLEY MTR SLVR 16FR STAT (SET/KITS/TRAYS/PACK) ×2 IMPLANT
TUBING CONNECTING 10 (TUBING) ×6 IMPLANT
TUBING STONE CATCHER TRILOGY (MISCELLANEOUS) IMPLANT
TUBING UROLOGY SET (TUBING) ×2 IMPLANT
WATER STERILE IRR 1000ML POUR (IV SOLUTION) ×2 IMPLANT

## 2020-03-31 NOTE — Anesthesia Preprocedure Evaluation (Addendum)
Anesthesia Evaluation  Patient identified by MRN, date of birth, ID band Patient awake    Reviewed: Allergy & Precautions, NPO status , Patient's Chart, lab work & pertinent test results  Airway Mallampati: II  TM Distance: >3 FB Neck ROM: Full    Dental no notable dental hx.    Pulmonary neg pulmonary ROS,    Pulmonary exam normal breath sounds clear to auscultation       Cardiovascular hypertension, Pt. on medications Normal cardiovascular exam Rhythm:Regular Rate:Normal  ECG: NSR, rate 66   Neuro/Psych negative neurological ROS  negative psych ROS   GI/Hepatic negative GI ROS, Neg liver ROS,   Endo/Other  diabetes, Oral Hypoglycemic Agents  Renal/GU Renal disease     Musculoskeletal negative musculoskeletal ROS (+)   Abdominal   Peds  Hematology negative hematology ROS (+)   Anesthesia Other Findings LEFT RENAL STONE  Reproductive/Obstetrics                            Anesthesia Physical Anesthesia Plan  ASA: II  Anesthesia Plan: General   Post-op Pain Management:    Induction: Intravenous  PONV Risk Score and Plan: 3 and Ondansetron, Dexamethasone, Treatment may vary due to age or medical condition and Midazolam  Airway Management Planned: Oral ETT  Additional Equipment:   Intra-op Plan:   Post-operative Plan: Extubation in OR  Informed Consent: I have reviewed the patients History and Physical, chart, labs and discussed the procedure including the risks, benefits and alternatives for the proposed anesthesia with the patient or authorized representative who has indicated his/her understanding and acceptance.     Dental advisory given  Plan Discussed with: CRNA  Anesthesia Plan Comments:        Anesthesia Quick Evaluation

## 2020-03-31 NOTE — Consult Note (Signed)
Chief Complaint: Patient was seen in consultation today for left percutaneous nephrostomy/nephroureteral catheter placement  Referring Physician(s): Eskridge,M  Supervising Physician: Marliss Coots  Patient Status: [facility] OP TBA  History of Present Illness: Jeremy Moore is a 60 y.o. male with past medical history of diabetes, hypertension, and nephrolithiasis with recent CT scan in December of last year showing a 2.7 x 1.3 stone in the left mid to lower pole extending to the renal pelvis.  He presents today for left percutaneous nephrostomy/nephroureteral catheter placement prior to nephrolithotomy.  Past Medical History:  Diagnosis Date  . Diabetes mellitus without complication (HCC)    type 2   . History of kidney stones   . Hypertension   . Pneumonia    hx of     Past Surgical History:  Procedure Laterality Date  . CERVICAL DISC ARTHROPLASTY      Allergies: Patient has no known allergies.  Medications: Prior to Admission medications   Medication Sig Start Date End Date Taking? Authorizing Provider  amLODipine-benazepril (LOTREL) 5-20 MG capsule Take 1 capsule by mouth daily. 02/09/20  Yes [provider]  JARDIANCE 25 MG TABS tablet Take 25 mg by mouth daily. 12/13/19  Yes [provider]     Family History  Family history unknown: Yes    Social History   Socioeconomic History  . Marital status: Married    Spouse name: Not on file  . Number of children: Not on file  . Years of education: Not on file  . Highest education level: Not on file  Occupational History  . Not on file  Tobacco Use  . Smoking status: Never Smoker  . Smokeless tobacco: Never Used  Vaping Use  . Vaping Use: Never used  Substance and Sexual Activity  . Alcohol use: Never  . Drug use: Never  . Sexual activity: Not on file  Other Topics Concern  . Not on file  Social History Narrative  . Not on file   Social Determinants of Health   Financial Resource  Strain: Not on file  Food Insecurity: Not on file  Transportation Needs: Not on file  Physical Activity: Not on file  Stress: Not on file  Social Connections: Not on file      Review of Systems denies fever, headache, chest pain, dyspnea, cough, abdominal/back pain, nausea, vomiting, dysuria, hematuria.  Vital Signs: Blood pressure 131/85, temperature 97.8, heart rate 68, respirations 18, O2 sat 100% room air    Physical Exam awake, alert.  Chest clear to auscultation bilaterally.  Heart with regular rate and rhythm.  Abdomen soft, positive bowel sounds, nontender.  No lower extremity edema.  Imaging: No results found.  Labs:  CBC: Recent Labs    03/22/20 1343  WBC 6.0  HGB 13.3  HCT 41.6  PLT 272    COAGS: No results for input(s): INR, APTT in the last 8760 hours.  BMP: Recent Labs    03/22/20 1343  NA 138  K 4.5  CL 102  CO2 25  GLUCOSE 216*  BUN 34*  CALCIUM 10.3  CREATININE 0.99  GFRNONAA >60    LIVER FUNCTION TESTS: No results for input(s): BILITOT, AST, ALT, ALKPHOS, PROT, ALBUMIN in the last 8760 hours.  TUMOR MARKERS: No results for input(s): AFPTM, CEA, CA199, CHROMGRNA in the last 8760 hours.  Assessment and Plan: 60 y.o. male with past medical history of diabetes, hypertension, and nephrolithiasis with recent CT scan in December of last year showing a 2.7 x  1.3 stone in the left mid to lower pole extending to the renal pelvis.  He presents today for left percutaneous nephrostomy/nephroureteral catheter placement prior to nephrolithotomy.Risks and benefits of left PCN placement was discussed with the patient including, but not limited to, infection, bleeding, significant bleeding causing loss or decrease in renal function or damage to adjacent structures.   All of the patient's questions were answered, patient is agreeable to proceed.  Consent signed and in chart.      Thank you for this interesting consult.  I greatly enjoyed meeting  Jeremy Moore and look forward to participating in their care.  A copy of this report was sent to the requesting provider on this date.  Electronically Signed: D. Jeananne Rama, PA-C 03/31/2020, 8:42 AM   I spent a total of 25 minutes    in face to face in clinical consultation, greater than 50% of which was counseling/coordinating care for left percutaneous nephrostomy/nephroureteral catheter placement

## 2020-03-31 NOTE — Plan of Care (Signed)

## 2020-03-31 NOTE — H&P (Signed)
H&P  Chief Complaint: Nephrolithiasis  History of Present Illness: Jeremy Moore is a 60 y.o. male with large left renal stone (CT A/P 12/2019 with 2.7 x 1.3 cm stone in the left mid to lower pole extending to the renal pelvis) who presents today for scheduled left nephroureteral stent with IR followed by left PCNL. Preop urine culture no growth.   Past Medical History:  Diagnosis Date  . Diabetes mellitus without complication (HCC)    type 2   . History of kidney stones   . Hypertension   . Pneumonia    hx of     Past Surgical History:  Procedure Laterality Date  . CERVICAL DISC ARTHROPLASTY      Home Medications:  No current facility-administered medications on file prior to encounter.   Current Outpatient Medications on File Prior to Encounter  Medication Sig Dispense Refill  . amLODipine-benazepril (LOTREL) 5-20 MG capsule Take 1 capsule by mouth daily.    Marland Kitchen JARDIANCE 25 MG TABS tablet Take 25 mg by mouth daily.       Allergies: No Known Allergies  Family History  Family history unknown: Yes    Social History:  reports that he has never smoked. He has never used smokeless tobacco. He reports that he does not drink alcohol and does not use drugs.  ROS: A complete review of systems was performed.  All systems are negative except for pertinent findings as noted.  Physical Exam:  Vital signs in last 24 hours:   Constitutional:  Alert and oriented Cardiovascular: Regular rate Respiratory: Normal respiratory effort Neurologic: Grossly intact, no focal deficits Psychiatric: Normal mood and affect   Laboratory Data:  No results for input(s): WBC, HGB, HCT, PLT in the last 72 hours.  No results for input(s): NA, K, CL, GLUCOSE, BUN, CALCIUM, CREATININE in the last 72 hours.  Invalid input(s): CO3   No results found for this or any previous visit (from the past 24 hour(s)). Recent Results (from the past 240 hour(s))  SARS CORONAVIRUS 2 (TAT 6-24 HRS)  Nasopharyngeal Nasopharyngeal Swab     Status: None   Collection Time: 03/28/20  9:36 AM   Specimen: Nasopharyngeal Swab  Result Value Ref Range Status   SARS Coronavirus 2 NEGATIVE NEGATIVE Final    Comment: (NOTE) SARS-CoV-2 target nucleic acids are NOT DETECTED.  The SARS-CoV-2 RNA is generally detectable in upper and lower respiratory specimens during the acute phase of infection. Negative results do not preclude SARS-CoV-2 infection, do not rule out co-infections with other pathogens, and should not be used as the sole basis for treatment or other patient management decisions. Negative results must be combined with clinical observations, patient history, and epidemiological information. The expected result is Negative.  Fact Sheet for Patients: HairSlick.no  Fact Sheet for Healthcare Providers: quierodirigir.com  This test is not yet approved or cleared by the [place] FDA and  has been authorized for detection and/or diagnosis of SARS-CoV-2 by FDA under an Emergency Use Authorization (EUA). This EUA will remain  in effect (meaning this test can be used) for the duration of the COVID-19 declaration under Se ction 564(b)(1) of the Act, 21 U.S.C. section 360bbb-3(b)(1), unless the authorization is terminated or revoked sooner.  Performed at [facility], [address], [place], [place] [address]     Renal Function: No results for input(s): CREATININE in the last 168 hours. CrCl cannot be calculated (Unknown ideal weight.).  Radiologic Imaging: EXAM: CT ABDOMEN AND PELVIS WITH  CONTRAST  TECHNIQUE: Multidetector CT imaging of the abdomen and pelvis was performed using the standard protocol following bolus administration of intravenous contrast.  CONTRAST:  ISOVUE-300 IOPAMIDOL (ISOVUE-300) INJECTION 61%  COMPARISON:  CT abdomen pelvis 04/06/2003  FINDINGS: Lower chest: Calcified granuloma  in the right lower lobe. Otherwise no acute abnormality.  Hepatobiliary: Redemonstration of a couple of vague subcentimeter hypodensities within the liver are too small to characterize. Diffusely decreased hepatic parenchyma compared to the splenic parenchyma. No new focal liver abnormality. No gallstones, gallbladder wall thickening, or pericholecystic fluid. No biliary dilatation.  Pancreas: No focal lesion. Normal pancreatic contour. No surrounding inflammatory changes. No main pancreatic ductal dilatation.  Spleen: Normal in size without focal abnormality.  Adrenals/Urinary Tract: No adrenal nodule bilaterally. Bilateral renal cortical scarring bilateral kidneys enhance symmetrically. Bilateral extrarenal pelvis. Interval development of a 2.7 cm left renal calculus. Associated focal calyceal dilatation within the inferior polar and interpolar regions of the left kidney. Several other smaller left calcified renal stones measuring up to 4 mm are noted. No definite nephrolithiasis on the right. Bilateral fluid density lesions likely represent simple renal cysts. There is a right renal 1.1 cm fluid density lesion with suggestion of a central nodularity (2:32). Subcentimeter hypodensities are too small to characterize. No frank hydronephrosis. No hydroureter. The urinary bladder is unremarkable.  Stomach/Bowel: PO contrast reaches the mid to distal small bowel. Stomach is within normal limits. No evidence of bowel wall thickening or dilatation. Appendix appears normal.  Vascular/Lymphatic: No abdominal aorta or iliac aneurysm. Mild atherosclerotic plaque of the aorta and its branches. No abdominal, pelvic, or inguinal lymphadenopathy.  Reproductive: Prostate is prominent in size measuring up to 4.5 cm.  Other: No intraperitoneal free fluid. No intraperitoneal free gas. No organized fluid collection.  Musculoskeletal:  Tiny fat containing umbilical hernia no  definite inguinal hernia. No suspicious lytic or blastic osseous lesions. No acute displaced fracture. L4-L5 and L5-S1 facet arthropathy.  IMPRESSION: 1. Interval development of a 2.7 cm left renal calculus with suggestion of associated focal calyceal dilatation within the inferior polar and interpolar regions of the left kidney. Several other nonobstructive left nephrolithiasis measuring up to 3 mm. 2. An indeterminate 1.1 cm right renal fluid density lesion with suggestion of an associated nodularity. Recommend MRI renal protocol for further evaluation. 3.  Aortic Atherosclerosis (ICD10-I70.0).  These results will be called to the ordering clinician or representative by the Radiologist Assistant, and communication documented in the PACS or [employer].  Impression/Assessment:  Jeremy Moore is a 60 y.o. male with large left renal stone (CT A/P 12/2019 with 2.7 x 1.3 cm stone in the left mid to lower pole extending to the renal pelvis) who presents  today for scheduled left nephroureteral stent with IR followed by left PCNL. Risks and benefits of left PCNL were discussed with the patient including, but not limited to, infection, bleeding, damage to adjacent structures, and possible need for second procedure if residual stone remaining.   All of the patient's questions were answered, patient is agreeable to proceed.  Consent signed and in chart.  Plan:  - Proceed with left nephroureteral stent with IR followed by PCNL with access as planned - Will admit to hospital post-procedurally for observation   Margette Fast 03/31/2020, 7:51 AM

## 2020-03-31 NOTE — Anesthesia Procedure Notes (Signed)
Procedure Name: Intubation Date/Time: 03/31/2020 11:24 AM Performed by: Niel Hummer, CRNA Pre-anesthesia Checklist: Patient identified, Emergency Drugs available, Suction available and Patient being monitored Patient Re-evaluated:Patient Re-evaluated prior to induction Oxygen Delivery Method: Circle system utilized Preoxygenation: Pre-oxygenation with 100% oxygen Induction Type: IV induction Ventilation: Mask ventilation without difficulty Laryngoscope Size: Mac and 4 Grade View: Grade II Tube type: Oral Tube size: 7.5 mm Number of attempts: 1 Airway Equipment and Method: Stylet Placement Confirmation: positive ETCO2,  ETT inserted through vocal cords under direct vision and breath sounds checked- equal and bilateral Secured at: 23 cm Tube secured with: Tape Dental Injury: Teeth and Oropharynx as per pre-operative assessment

## 2020-03-31 NOTE — Op Note (Addendum)
Preoperative Diagnosis: Left nephrolithiasis (total stone burden > 2 cm)   Postoperative Diagnosis: Left nephrolithiasis (total stone burden > 2 cm)   Procedure(s) Performed: 1. Left percutaneous nephrostolithotomy through prior access for stone burden greater than 2 cm 2. Simple Foley catheter placement 3. Intraoperative fluoroscopy with interpretation less than 1 hour 4. Left nephrostomy tube and open-ended ureteral catheter placement    Attending Surgeon:  Festus Aloe, M.D.   Resident Surgeon:  Meade Maw.   Assistants:  None listed   Anesthesia:  General via endotracheal tube.     IV Fluids:  See Anesthesia record.   Estimated Blood Loss:  100 mLs.   Cultures: None   Drains:  - 18 French Foley urethral catheter to drainage - 5 Fr open-ended catheter serving as ureteral stent - 18 French Foley catheter catheter serving as nephrostomy tube to drainage  Implants: None   Specimens: Left renal stone   Complications:  None.   Indications for Surgery:  Jeremy Moore is a 60 y.o. male with large left renal stone (CT A/P 12/2019 with 2.7 x 1.3 cm stone in the left mid to lower pole extending to the renal pelvis) who presents today for scheduled left nephroureteral stent with IR followed by left PCNL. The risks and benefits of PCNL were discussed with the patient, who wishes to proceed. Informed consent was obtained.  IR successfully placed a nephroureteral stent through lower pole access earlier today.    Operative Findings:  - We used the previously-placed lower pole access to gain entry to the left collecting system, where we treated the entirety of the patient's renal stone burden. No residual stones noted on subsequent nephroscopy - Successful placement of left nephrostomy tube and open-ended catheter serving as ureteral stent   Radiologic Interpretation: - Antegrade pyelogram at beginning of case demonstrated lower pole access with large renal pole stone -  No residual stones visible on fluoroscopy at completion of case. Antegrade pyelogram without filling defect and widely-patent ureter - Successful placement of left nephrostomy tube and open-ended catheter serving as ureteral stent   Procedure:   The patient was correctly identified in the preoperative holding area, where written informed consent was obtained. The patient was brought back to the operative suit, and a preinduction timeout was performed. Once correct information was verified, general anesthesia was induced via endotracheal tube. Sequential compression devices were placed for VTE prophylaxis. An 18 Fr foley catheter was placed using sterile technique. The patient was then gently repositioned into the prone split leg position, paying careful attention to pad all pressure points. He was affixed to the bed at multiple points of contact. Appropriate peri-procedural antibiotics of Ancef was administered.  He was then prepped and draped in the usual sterile fashion.   A surgical timeout was then performed confirming the correct patient, surgery, and laterality.   We began the case by performing an antegrade pyelogram per his previously-placed left nephroureteral stent. This demonstrated lower pole access with large renal pelvic stone. We advanced a sensor wire through the nephroureteral stent and into the bladder under fluoroscopic guidance. We removed the nephroureteral stent in entirety leaving wire in place.  Using a 15 blade scalpel, we enlarged the skin incision to ~1 cm. We advanced a 10 Fr dual lumen open-ended catheter over the sensor wire, but met resistance at the level of the renal pelvic stone. Thus, we removed the dual lumen open-ended catheter leaving sensor wire in place, advanced a 5 Fr open-ended catheter over the  sensor wire into the ureter, and switched the sensor wire for a super stiff wire. We removed the open-ended catheter leaving super stiff wife in place. We were then able to  advance the 10 Fr dual lumen open-ended catheter over the super stiff wire to the level of the proximal ureter and advanced the sensor wire into ureter down into the bladder under fluoroscopic guidance. We removed the open-ended catheter leaving both wires in place. We then developed our percutaneous tract with the advancement of a 30 French x 20 cm balloon dilator over our super stiff wire.  After this, a 93 French sheath was advanced to the edge of the distal calyx on fluoroscopy.     We then performed rigid nephroscopy with the trilogy lithotrite. Immediately upon entrance into the collecting system, we encountered the large renal stone, and we then proceeded to treat the stone with lithotripsy. Once all visible stone had been cleared, we switched to a flexible nephroscope. We visualized the upper and interpolar calces, in which no stones were present. We withdrew our sheath over our flexible nephroscope to the edge of renal parenchyma, which did not reveal any residual stone fragments. No residual stones were visible on fluoroscopy, and there were no filling defects on subsequent pyelogram with widely-patent ureter. We then removed the flexible nephroscope.  We advanced an 18 Fr foley catheter over our super stiff wire into the renal pelvis. We inflated the balloon with 2cc contrast. We removed the indwelling super stiff wire. We performed a pyelogram through the foley catheter, which demonstrated good positioning of the catheter within the renal pelvis. We then advanced a 5 Fr open-ended catheter over the sensor wire down to the level of the distal ureter. We removed the indwelling sensor wire.   We then individually affixed both the nephrostomy tube and 5 Fr open-ended catheter to the skin with silk sutures x 2. There was minimal bleeding from the access site. The site was then dressed in gauze and tape. The nephrostomy tube was placed to drainage. The patient was carefully returned to supine position.     At this point, the patient was extubated and taken to the recovery area in stable fashion.   Post-operative plan: - Admit to urology service for routine post-operative care - Anticipate Foley catheter removal on POD1 - Anticipate removal of open-ended catheter on POD1 and nephrostomy tube capping trial. If passes capping trial, then will remove nephrostomy tube. If discharged without tubes, then plan for follow up in 6-8 weeks with RUS prior  - Continue antibiotics while tubes in place  I was present and scrubbed for the entire procedure. I spoke with his wife Jeremy Moore after the procedures and discussed the procedure, post-op care and follow-up with her.

## 2020-03-31 NOTE — Transfer of Care (Signed)
Immediate Anesthesia Transfer of Care Note  Patient: Jeremy Moore  Procedure(s) Performed: NEPHROLITHOTOMY PERCUTANEOUS/ INTERVENTIONAL RADIOLOGY TO PLACE NEPHROSTOMY TUBE PRIOR (Left )  Patient Location: PACU  Anesthesia Type:General  Level of Consciousness: awake, alert  and oriented  Airway & Oxygen Therapy: Patient Spontanous Breathing and Patient connected to face mask oxygen  Post-op Assessment: Report given to RN, Post -op Vital signs reviewed and stable and Patient moving all extremities X 4  Post vital signs: Reviewed and stable  Last Vitals:  Vitals Value Taken Time  BP 148/87 03/31/20 1306  Temp    Pulse 91 03/31/20 1307  Resp 21 03/31/20 1307  SpO2 100 % 03/31/20 1307  Vitals shown include unvalidated device data.  Last Pain:  Vitals:   03/31/20 0841  PainSc: 0-No pain         Complications: No complications documented.

## 2020-03-31 NOTE — Anesthesia Postprocedure Evaluation (Signed)
Anesthesia Post Note  Patient: Jeremy Moore  Procedure(s) Performed: NEPHROLITHOTOMY PERCUTANEOUS/ INTERVENTIONAL RADIOLOGY TO PLACE NEPHROSTOMY TUBE PRIOR (Left )     Patient location during evaluation: PACU Anesthesia Type: General Level of consciousness: awake Pain management: pain level controlled Vital Signs Assessment: post-procedure vital signs reviewed and stable Respiratory status: spontaneous breathing, nonlabored ventilation, respiratory function stable and patient connected to nasal cannula oxygen Cardiovascular status: blood pressure returned to baseline and stable Postop Assessment: no apparent nausea or vomiting Anesthetic complications: no   No complications documented.  Last Vitals:  Vitals:   03/31/20 1734 03/31/20 2054  BP: 124/72 118/70  Pulse: 79 78  Resp: 18 18  Temp: 36.9 C 36.8 C  SpO2: 98% 99%    Last Pain:  Vitals:   03/31/20 2054  TempSrc: Oral  PainSc:                  Catheryn Bacon Jerlyn Pain

## 2020-03-31 NOTE — Procedures (Signed)
Interventional Radiology Procedure Note  Procedure: Left nephroureteral catheter placement for lithotripsy  Findings: Please refer to procedural dictation for full description.  Lower pole access, 5 Fr catheter placed with tip in bladder.    Complications: None immediate  Estimated Blood Loss: < 5 mL  Recommendations: To OR with Urology for lithotripsy.   Marliss Coots, MD Pager: [phone]

## 2020-04-01 ENCOUNTER — Encounter (HOSPITAL_COMMUNITY): Payer: Self-pay | Admitting: Urology

## 2020-04-01 DIAGNOSIS — N200 Calculus of kidney: Secondary | ICD-10-CM | POA: Diagnosis not present

## 2020-04-01 LAB — BASIC METABOLIC PANEL
Anion gap: 10 (ref 5–15)
BUN: 22 mg/dL (ref 8–23)
CO2: 24 mmol/L (ref 22–32)
Calcium: 9 mg/dL (ref 8.9–10.3)
Chloride: 101 mmol/L (ref 98–111)
Creatinine, Ser: 1.18 mg/dL (ref 0.61–1.24)
GFR, Estimated: >60 mL/min (ref >60)
Glucose, Bld: 265 mg/dL — ABNORMAL HIGH (ref 70–99)
Potassium: 4.3 mmol/L (ref 3.5–5.1)
Sodium: 135 mmol/L (ref 135–145)

## 2020-04-01 LAB — CBC
HCT: 40.5 % (ref 39.0–52.0)
Hemoglobin: 12.7 g/dL — ABNORMAL LOW (ref 13.0–17.0)
MCH: 29.2 pg (ref 26.0–34.0)
MCHC: 31.4 g/dL (ref 30.0–36.0)
MCV: 93.1 fL (ref 80.0–100.0)
Platelets: 229 10*3/uL (ref 150–400)
RBC: 4.35 MIL/uL (ref 4.22–5.81)
RDW: 12.5 % (ref 11.5–15.5)
WBC: 10.8 10*3/uL — ABNORMAL HIGH (ref 4.0–10.5)
nRBC: 0 % (ref 0.0–0.2)

## 2020-04-01 LAB — HIV ANTIBODY (ROUTINE TESTING W REFLEX): HIV Screen 4th Generation wRfx: NONREACTIVE

## 2020-04-01 MED ORDER — TAMSULOSIN HCL 0.4 MG PO CAPS: 0.4000 mg | ORAL_CAPSULE | Freq: Every day | ORAL | 0 refills | Status: AC

## 2020-04-01 MED ORDER — ACETAMINOPHEN 500 MG PO TABS
1000.0000 mg | ORAL_TABLET | Freq: Four times a day (QID) | ORAL | Status: DC
  Administered 2020-04-01 – 2020-04-02 (×3): 1000 mg via ORAL
  Filled 2020-04-01 (×2): qty 2

## 2020-04-01 NOTE — Discharge Summary (Addendum)
Date of admission: 03/31/2020  Date of discharge: 04/02/2020  Admission diagnosis: Nephrolithiasis  Discharge diagnosis: Nephrolithiasis  Secondary diagnoses: Diabetes  History and Physical: For full details, please see admission history and physical. Briefly, Jeremy Moore is a 60 y.o. male with large left renal stone (CT A/P 12/2019 with 2.7 x 1.3 cm stone in the left mid to lower pole extending to the renal pelvis) who presented on 03/31/20 for scheduled left nephroureteral stent with IR followed by left PCNL.   Hospital Course:  The patient underwent left nephroureteral stent placement by IR followed by left PCNL with urology on 03/31/20. A foley catheter, nephrostomy tube, and open-ended catheter acting as a ureteral stent were placed intra-operatively. He tolerated the procedure well and was transferred to the floor after receiving routine post-operative care.  His diet was gradually advanced, and his pain was controlled with analgesics. On POD1, his foley catheter was removed, and he voided spontaneously. His open-ended catheter was removed, and his nephrostomy tube was capped. Unfortunately, he failed a capping trial with nausea/emesia and significant flank pain, so his nephrostomy tube was placed back to drainage. He preferred to stay overnight and repeat a capping trial on POD2.  His nephrostomy tube was again capped on POD2. He subsequently passed a capping trial, and his nephrostomy tube was removed on POD2.  By later on POD2, he was tolerating a regular diet without nausea/emesis, having pain controlled with oral analgesics, ambulating, and voiding spontaneously. Thus, he was deemed appropriate for discharge home.  Follow up in 6-8 weeks with renal ultrasound prior.   His blood sugars were fairly elevated during his hospitalization (200-300s) despite ISS with pre-operative blood sugar of ~200, so he was instructed to follow up with his PCP within 1-2 weeks of discharge for diabetic  optimization.  Laboratory values:  Recent Labs    03/31/20 2038 04/01/20 0523 04/02/20 0522  HGB 12.7* 12.7* 12.6*  HCT 40.6 40.5 39.9   Recent Labs    04/01/20 0523 04/02/20 0522  CREATININE 1.18 1.09    Disposition: Home  Discharge instruction: The patient was instructed to be ambulatory but told to refrain from heavy lifting, strenuous activity, or driving.   Discharge medications:  Allergies as of 04/02/2020   No Known Allergies     Medication List    TAKE these medications   amLODipine-benazepril 5-20 MG capsule Commonly known as: LOTREL Take 1 capsule by mouth daily.   Jardiance 25 MG Tabs tablet Generic drug: empagliflozin Take 25 mg by mouth daily.   oxyCODONE-acetaminophen 5-325 MG tablet Commonly known as: Percocet Take 1 tablet by mouth every 4 (four) hours as needed for up to 18 doses for severe pain.   tamsulosin 0.4 MG Caps capsule Commonly known as: Flomax Take 1 capsule (0.4 mg total) by mouth daily after supper for 7 days.            Discharge Care Instructions  (From admission, onward)         Start     Ordered   04/01/20 0000  Discharge wound care:       Comments: Cover left flank incision with gauze and tape as needed to prevent soilage of clothing   04/01/20 1025          Followup: 6-8 weeks with RUS prior   I have seen and examined the patient and agree with the above assessment and plan.  Matt R. Enmanuel Zufall MD [facility]  Pager: [phone]

## 2020-04-01 NOTE — Plan of Care (Signed)
  Problem: Education: Goal: Knowledge of General Education information will improve Description: Including pain rating scale, medication(s)/side effects and non-pharmacologic comfort measures Outcome: Progressing   Problem: Clinical Measurements: Goal: Will remain free from infection Outcome: Progressing   Problem: Activity: Goal: Risk for activity intolerance will decrease Outcome: Progressing   Problem: Nutrition: Goal: Adequate nutrition will be maintained Outcome: Progressing   Problem: Coping: Goal: Level of anxiety will decrease Outcome: Progressing   Problem: Pain Managment: Goal: General experience of comfort will improve Outcome: Progressing   Problem: Safety: Goal: Ability to remain free from injury will improve Outcome: Progressing   Problem: Skin Integrity: Goal: Risk for impaired skin integrity will decrease Outcome: Progressing   

## 2020-04-01 NOTE — Progress Notes (Addendum)
1 Day Post-Op Subjective: NAEON. AFHDS. Pain controlled. Tolerating regular diet. Ambulating. Great UOP via foley catheter and nephrostomy tube, both blood tinged. Hgb stable. Cr normalized.  His foley catheter and open-ended catheter acting as a ureteral stent were removed this morning, and his nephrostomy tube was capped. Unfortunately, he failed a capping trial with nausea/emesis and increased pain, so his nephrostomy tube was uncapped.   Objective: Vital signs in last 24 hours: Temp:  [97.3 F (36.3 C)-98.4 F (36.9 C)] 98.3 F (36.8 C) (03/12 0916) Pulse Rate:  [66-94] 71 (03/12 0916) Resp:  [10-18] 18 (03/12 0916) BP: (117-148)/(70-99) 147/81 (03/12 0916) SpO2:  [98 %-100 %] 98 % (03/12 0916)  Intake/Output from previous day: 03/11 0701 - 03/12 0700 In: 2866.2 [I.V.:2666.2; IV Piggyback:200.1] Out: 2925 [Urine:2925] Intake/Output this shift: No intake/output data recorded.  Physical Exam:  General: Alert and oriented Cardiac: Regular rate Pulmonary: NWOB on RA GU: Foley catheter draining blood tinged urine (removed by urology on rounds). Left flank incision covered with clean/dry dressing. Open-ended catheter removed. Nephrostomy tube draining blood tinged urine (capped by urology on rounds but uncapped by RN after failed capping trial) Extremities: Warm and well-perfused Neuro: Answering questions appropriately  Lab Results: Recent Labs    03/31/20 0836 03/31/20 2038 04/01/20 0523  HGB 13.8 12.7* 12.7*  HCT 44.2 40.6 40.5   BMET Recent Labs    03/31/20 2038 04/01/20 0523  NA 134* 135  K 4.7 4.3  CL 98 101  CO2 25 24  GLUCOSE 323* 265*  BUN 23 22  CREATININE 1.47* 1.18  CALCIUM 9.2 9.0     Studies/Results: DG C-Arm 1-60 Min-No Report  Result Date: 03/31/2020 Fluoroscopy was utilized by the requesting physician.  No radiographic interpretation.   IR URETERAL STENT LEFT NEW ACCESS W/O SEP NEPHROSTOMY CATH  Result Date: 03/31/2020 INDICATION: 60 year  old male with left nephrolithiasis presenting for nephrostomy access prior to lithotripsy. EXAM: 1. Ultrasound and fluoroscopic guided percutaneous nephrostomy access 2. Placement of left nephroureteral catheter COMPARISON:  12/31/2019 MEDICATIONS: Ceftriaxone, 2 g, intravenous; The antibiotic was administered in an appropriate time frame prior to skin puncture. ANESTHESIA/SEDATION: Fentanyl 100 mcg IV; Versed 4 mg IV Moderate Sedation Time:  15 The patient was continuously monitored during the procedure by the interventional radiology nurse under my direct supervision. CONTRAST:  10 mL Omnipaque 300-administered into the collecting system(s) FLUOROSCOPY TIME:  Fluoroscopy Time: 2 minutes 30 seconds (17 mGy). COMPLICATIONS: None immediate. PROCEDURE: Informed written consent was obtained from the patient after a thorough discussion of the procedural risks, benefits and alternatives. All questions were addressed. Maximal Sterile Barrier Technique was utilized including caps, mask, sterile gowns, sterile gloves, sterile drape, hand hygiene and skin antiseptic. A timeout was performed prior to the initiation of the procedure. Preprocedure ultrasound demonstrated minimal hydronephrosis with shadowing nephrolithiasis in the left lower pole. Procedure was planned. Subdermal Local anesthesia was provided with 1% lidocaine at the planned needle entry site. A small skin nick was made. Deeper local anesthetic was provided along the renal capsule with direct ultrasound visualization. A 21 gauge, 20 cm Chiba needle was then directed under direct ultrasound visualization into a mildly dilated left lower pole calyx. The stylet was removed and there was immediate efflux of clear urine. A Nitrex wire was navigated around the indwelling left lower pole calculus in into the left ureter. The 6 French micro puncture and transition kit was then inserted. Limited hand injection of contrast demonstrated adequate position within the renal  pelvis. Through the outer sheath, a Bentson wire was then directed into the urinary bladder. The sheath was removed and a 5 French angled tip catheter was inserted with the tip in the urinary bladder. Hand injection of contrast demonstrated appropriate position within the urinary bladder. The catheter was sutured at the skin entry site with a 0 silk retention suture. The catheter was capped and a sterile bandage was applied. The patient tolerated procedure well was transferred back to the recovery room in anticipation of forthcoming lithotripsy. IMPRESSION: Technically successful left inferior pole percutaneous nephrostomy access with placement of a nephroureteral safety catheter with the distal tip located in the urinary bladder for planned lithotripsy later the same day. Ruthann Cancer, MD [facility] Radiology Electronically Signed   By: Ruthann Cancer MD   On: 03/31/2020 10:30    Assessment/Plan: Jeremy Moore is a 60 y.o. male with large left renal stone (CT A/P 12/2019 with 2.7 x 1.3 cm stone in the left mid to lower pole extending to the renal pelvis) who underwent left nephroureteral stent with IR followed by left PCNL with urology on 03/31/20.   Foley catheter removed this morning, pending TOV. Open-ended catheter acting as ureteral stent removed this morning. Nephrostomy tube capped, but uncapped after patient failed capping trial. Patient preferred to stay in hospital and re-attempt capping trial tomorrow in hopes of discharging tubeless.   - Diabetic diet, ML, bowel regimen - Continue left nephrostomy tube to drainage. Will attempt repeat capping trial tomorrow at 4am. Uncap nephrostomy tube and place back to drainage if patient experiences significant nausea/emesis and/or pain that is uncontrolled with PRN meds  - Continue ancef while nephrostomy tube in place during hospitalization  [facility] tylenol. PRN oxycodone - PRN zofran - Daily labs -  Continue home amlodipine. Hold home lisinopril given recent kidney surgery and normotensive - ISS. Consult diabetes management for uncontrolled BS despite ISS - SCDs, IS, encourage ambulation - EDD tomorrow with or without nephrostomy tube pending repeat capping trial      LOS: 0 days   Celene Squibb 04/01/2020, 11:42 AM  I have seen and examined the patient and agree with the above assessment and plan.  Pain controlled. No nausea or emesis. Developed increased pain after capping trial today. PCN to gravity.  -Pain contol prn -Continue anceph -Repeat capping trial tomorrow AM -Trend labs -Anticipate discharge home tomorrow  [facility]  Pager: [phone]

## 2020-04-02 DIAGNOSIS — N200 Calculus of kidney: Secondary | ICD-10-CM | POA: Diagnosis not present

## 2020-04-02 LAB — BASIC METABOLIC PANEL
Anion gap: 10 (ref 5–15)
BUN: 18 mg/dL (ref 8–23)
CO2: 27 mmol/L (ref 22–32)
Calcium: 9.2 mg/dL (ref 8.9–10.3)
Chloride: 101 mmol/L (ref 98–111)
Creatinine, Ser: 1.09 mg/dL (ref 0.61–1.24)
GFR, Estimated: >60 mL/min (ref >60)
Glucose, Bld: 227 mg/dL — ABNORMAL HIGH (ref 70–99)
Potassium: 3.7 mmol/L (ref 3.5–5.1)
Sodium: 138 mmol/L (ref 135–145)

## 2020-04-02 LAB — HEMOGLOBIN AND HEMATOCRIT, BLOOD
HCT: 39.9 % (ref 39.0–52.0)
Hemoglobin: 12.6 g/dL — ABNORMAL LOW (ref 13.0–17.0)

## 2020-04-02 MED ORDER — OXYCODONE-ACETAMINOPHEN 5-325 MG PO TABS: 1.0000 | ORAL_TABLET | ORAL | 0 refills | Status: DC | PRN

## 2020-04-02 NOTE — Progress Notes (Addendum)
No pain/nausea with plugging of nephrostomy. Pre medicated for pain and nausea.  Similar output between bladder and left kidney.

## 2020-04-02 NOTE — Plan of Care (Signed)
Pt to be d/c with self care  Problem: Education: Goal: Knowledge of General Education information will improve Description: Including pain rating scale, medication(s)/side effects and non-pharmacologic comfort measures 04/02/2020 0938 by Junius Finner, RN Outcome: Adequate for Discharge 04/02/2020 [phone] by Junius Finner, RN Outcome: Progressing   Problem: Clinical Measurements: Goal: Will remain free from infection 04/02/2020 0938 by Junius Finner, RN Outcome: Adequate for Discharge 04/02/2020 [phone] by Junius Finner, RN Outcome: Progressing   Problem: Activity: Goal: Risk for activity intolerance will decrease 04/02/2020 0938 by Junius Finner, RN Outcome: Adequate for Discharge 04/02/2020 [phone] by Junius Finner, RN Outcome: Progressing   Problem: Nutrition: Goal: Adequate nutrition will be maintained 04/02/2020 0938 by Junius Finner, RN Outcome: Adequate for Discharge 04/02/2020 [phone] by Junius Finner, RN Outcome: Progressing   Problem: Coping: Goal: Level of anxiety will decrease 04/02/2020 0938 by Junius Finner, RN Outcome: Adequate for Discharge 04/02/2020 [phone] by Junius Finner, RN Outcome: Progressing   Problem: Pain Managment: Goal: General experience of comfort will improve 04/02/2020 0938 by Junius Finner, RN Outcome: Adequate for Discharge 04/02/2020 [phone] by Junius Finner, RN Outcome: Progressing   Problem: Safety: Goal: Ability to remain free from injury will improve 04/02/2020 0938 by Junius Finner, RN Outcome: Adequate for Discharge 04/02/2020 [phone] by Junius Finner, RN Outcome: Progressing   Problem: Skin Integrity: Goal: Risk for impaired skin integrity will decrease 04/02/2020 0938 by Junius Finner, RN Outcome: Adequate for Discharge 04/02/2020 [phone] by Junius Finner, RN Outcome: Progressing

## 2020-04-02 NOTE — Progress Notes (Signed)
Pt reports shivering when getting up to bathroom after nephrostomy plugged.

## 2020-04-03 LAB — GLUCOSE, CAPILLARY
Glucose-Capillary: 195 mg/dL — ABNORMAL HIGH (ref 70–99)
Glucose-Capillary: 275 mg/dL — ABNORMAL HIGH (ref 70–99)
Glucose-Capillary: 204 mg/dL — ABNORMAL HIGH (ref 70–99)
Glucose-Capillary: 201 mg/dL — ABNORMAL HIGH (ref 70–99)
Glucose-Capillary: 335 mg/dL — ABNORMAL HIGH (ref 70–99)
Glucose-Capillary: 168 mg/dL — ABNORMAL HIGH (ref 70–99)
Glucose-Capillary: 193 mg/dL — ABNORMAL HIGH (ref 70–99)

## 2020-04-04 LAB — TYPE AND SCREEN
ABO/RH(D): O POS
Antibody Screen: NEGATIVE
Unit division: 0
Unit division: 0

## 2020-04-04 LAB — BPAM RBC
Blood Product Expiration Date: 202204022359
Blood Product Expiration Date: 202204022359
Unit Type and Rh: 5100
Unit Type and Rh: 5100

## 2022-01-01 IMAGING — CT CT ABD-PELV W/ CM
2 of 5 series · 15 of 46 positions shown, 17 images · IV contrast (iopamidol)
Comparison: CT abdomen pelvis 04/06/2003

CLINICAL DATA: Left lower quadrant pain, diverticulosis X 1 year.
No cancer, no surgery, nonsmoker.

EXAM:
CT ABDOMEN AND PELVIS WITH CONTRAST
TECHNIQUE: Multidetector CT imaging of the abdomen and pelvis was performed
using the standard protocol following bolus administration of
intravenous contrast.
CONTRAST:  100mL [id number] IOPAMIDOL ([id number]) INJECTION 61%

[Series 2: abd pelvis 5.00 br40 s3 axial · axial · 0.78mm/px · z∈[+1275,+1695]mm · 12 of 98 slices shown, 14 images]
[im 7/98  soft-tissue]
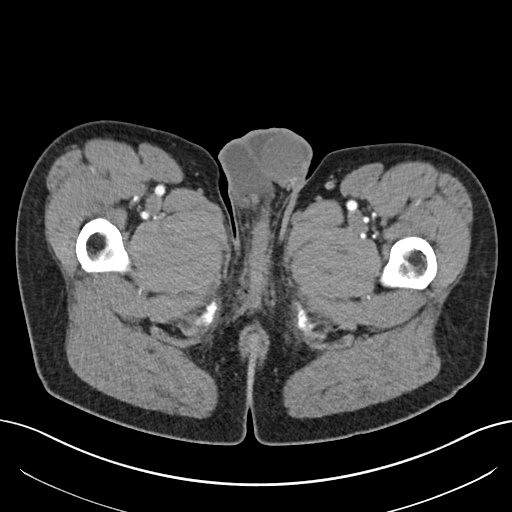
[im 7/98  bone]
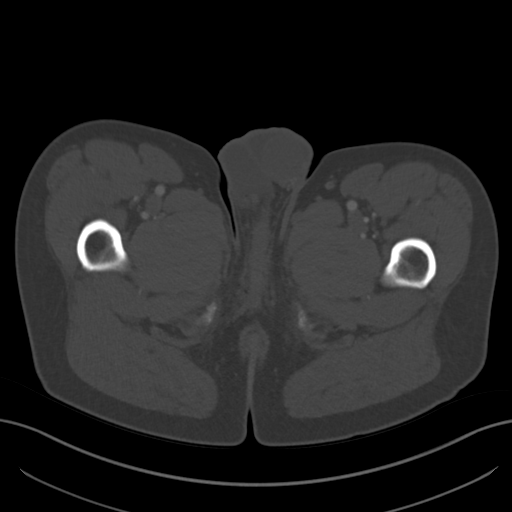
[im 13/98  soft-tissue]
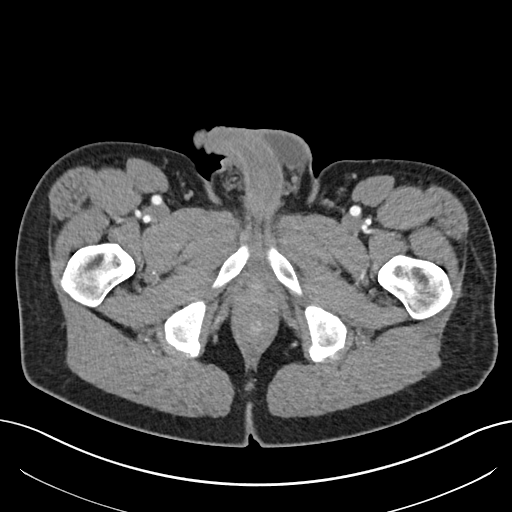
[im 25/98  soft-tissue]
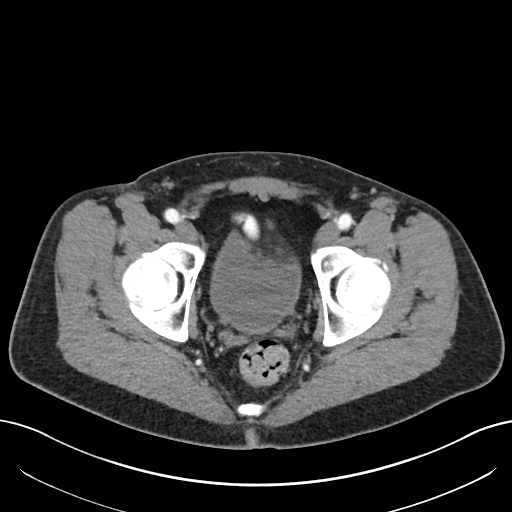
[im 31/98  soft-tissue]
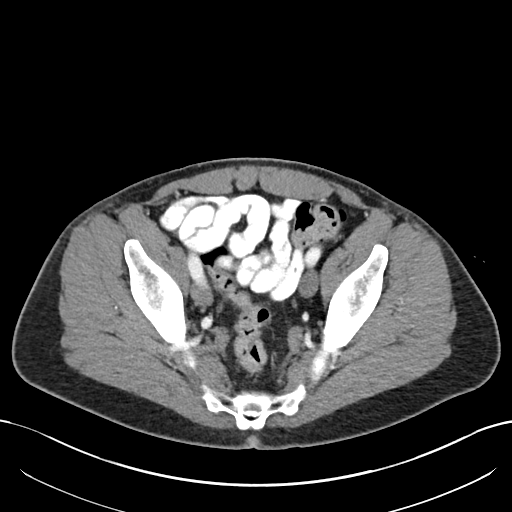
[im 37/98  soft-tissue]
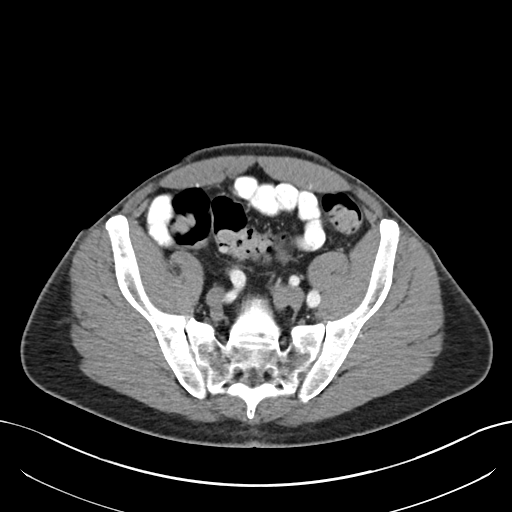
[im 43/98  soft-tissue]
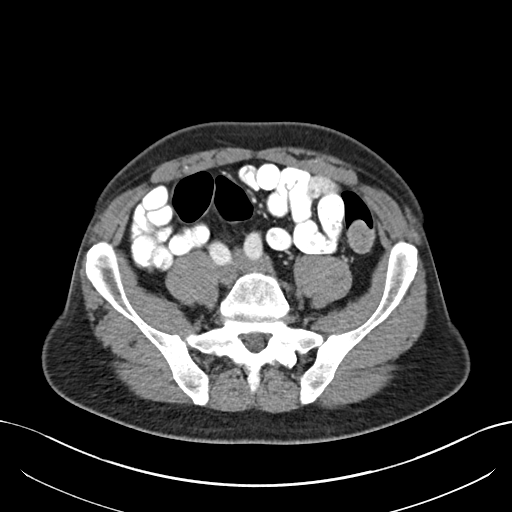
[im 55/98  soft-tissue]
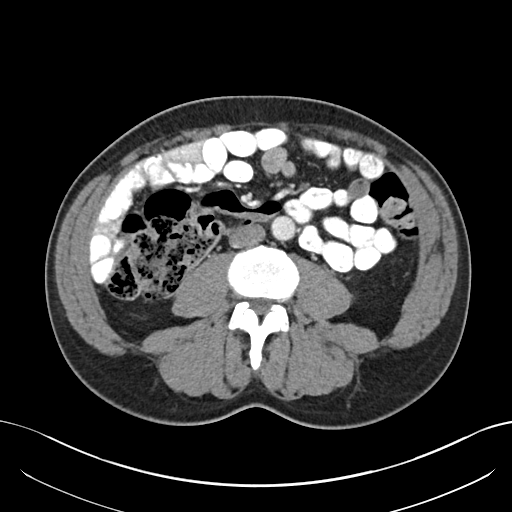
[im 61/98  soft-tissue]
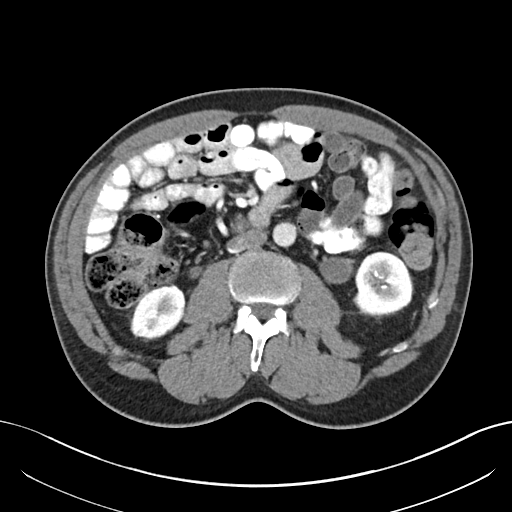
[im 67/98  soft-tissue]
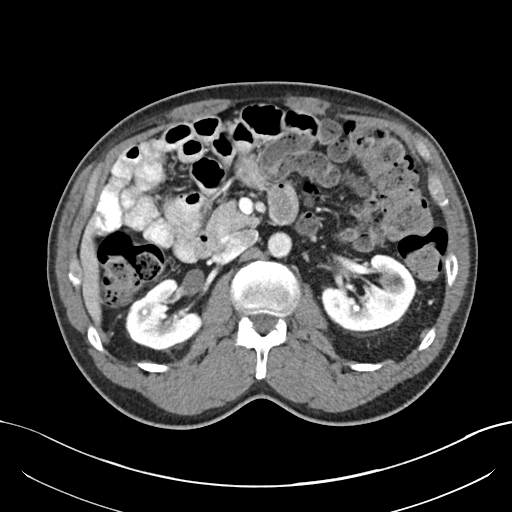
[im 67/98  bone]
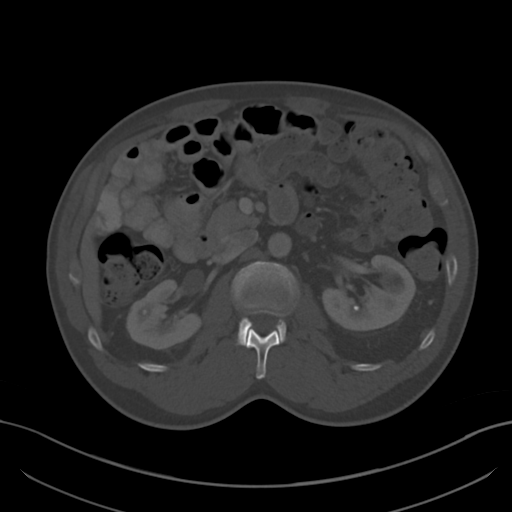
[im 73/98  soft-tissue]
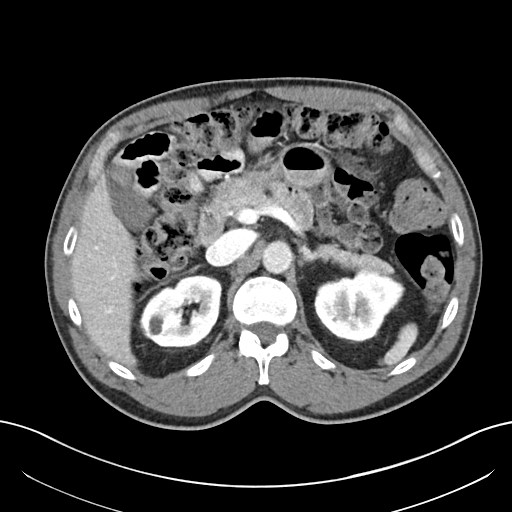
[im 85/98  soft-tissue]
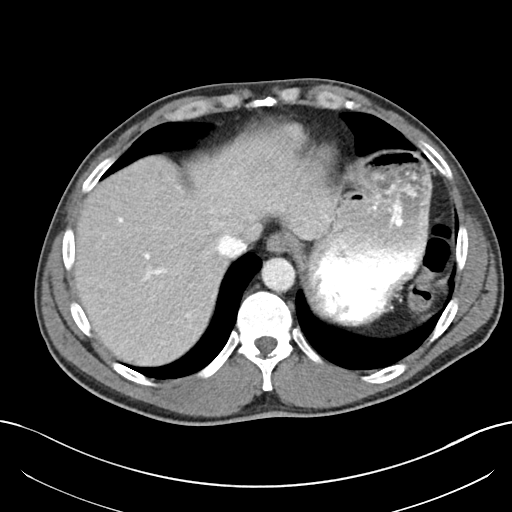
[im 91/98  soft-tissue]
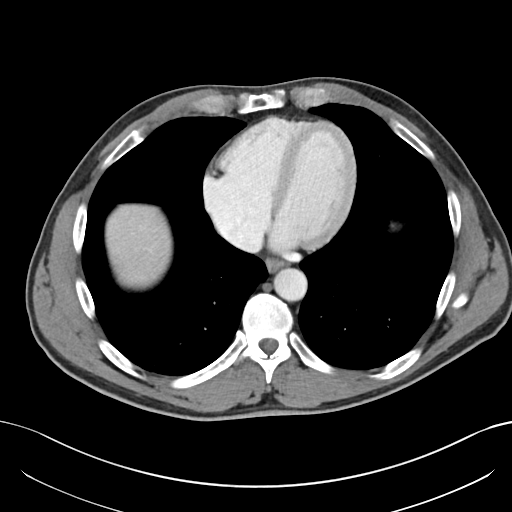

[Series 6: abd pelvis 2.00 br40 s3 cor · coronal · 0.78mm/px · 3 of 200 slices shown]
[im 67/200  soft-tissue]
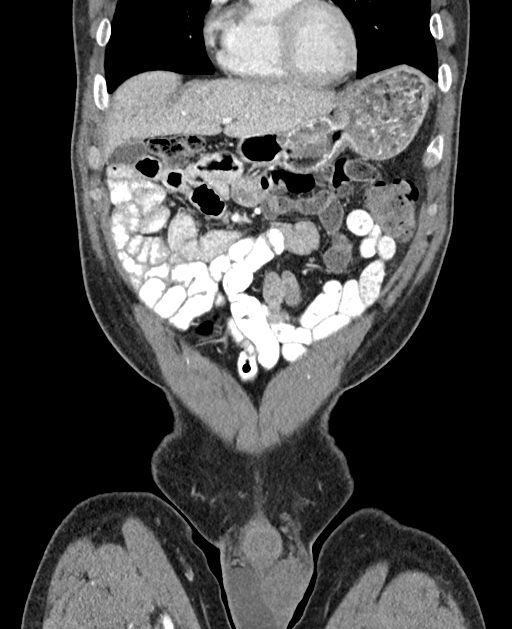
[im 89/200  soft-tissue]
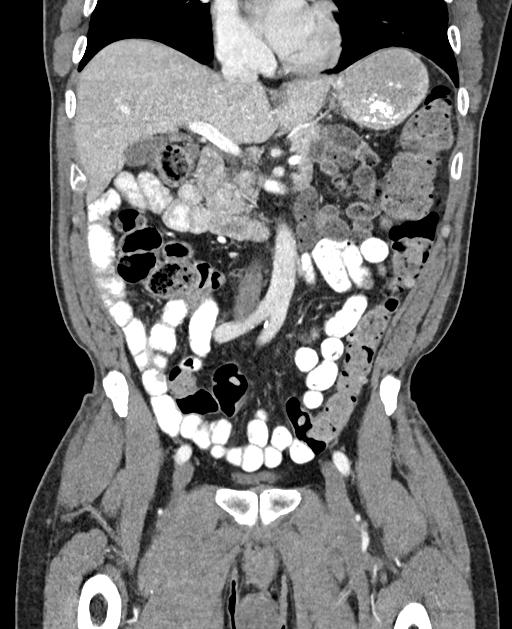
[im 111/200  soft-tissue]
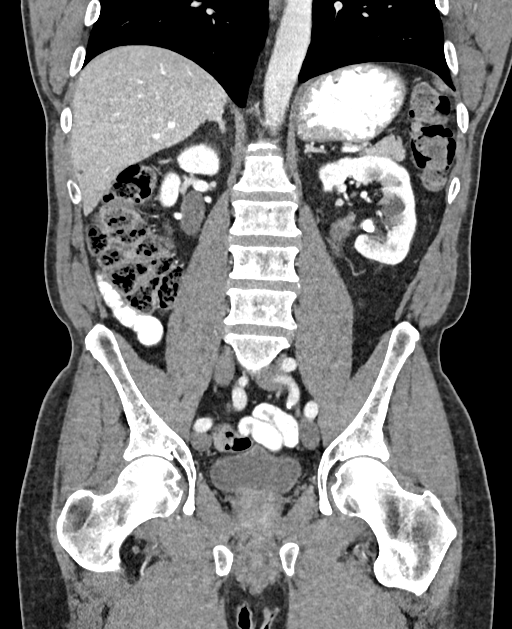

[15 of 46 positions shown; findings below may reference images not displayed]

FINDINGS: Lower chest: Calcified granuloma in the right lower lobe. Otherwise
no acute abnormality.

Hepatobiliary: Redemonstration of a couple of vague subcentimeter
hypodensities within the liver are too small to characterize.
Diffusely decreased hepatic parenchyma compared to the splenic
parenchyma. No new focal liver abnormality. No gallstones,
gallbladder wall thickening, or pericholecystic fluid. No biliary
dilatation.

Pancreas: No focal lesion. Normal pancreatic contour. No surrounding
inflammatory changes. No main pancreatic ductal dilatation.

Spleen: Normal in size without focal abnormality.

Adrenals/Urinary Tract: No adrenal nodule bilaterally. Bilateral
renal cortical scarring bilateral kidneys enhance symmetrically.
Bilateral extrarenal pelvis. Interval development of a 2.7 cm left
renal calculus. Associated focal calyceal dilatation within the
inferior polar and interpolar regions of the left kidney. Several
other smaller left calcified renal stones measuring up to 4 mm are
noted. No definite nephrolithiasis on the right. Bilateral fluid
density lesions likely represent simple renal cysts. There is a
right renal 1.1 cm fluid density lesion with suggestion of a central
nodularity ([DATE]). Subcentimeter hypodensities are too small to
characterize. No frank hydronephrosis. No hydroureter. The urinary
bladder is unremarkable.

Stomach/Bowel: PO contrast reaches the mid to distal small bowel.
Stomach is within normal limits. No evidence of bowel wall
thickening or dilatation. Appendix appears normal.

Vascular/Lymphatic: No abdominal aorta or iliac aneurysm. Mild
atherosclerotic plaque of the aorta and its branches. No abdominal,
pelvic, or inguinal lymphadenopathy.

Reproductive: Prostate is prominent in size measuring up to 4.5 cm.

Other: No intraperitoneal free fluid. No intraperitoneal free gas.
No organized fluid collection.

Musculoskeletal:

Tiny fat containing umbilical hernia no definite inguinal hernia. No
suspicious lytic or blastic osseous lesions. No acute displaced
fracture. L4-L5 and L5-S1 facet arthropathy.
IMPRESSION: 1. Interval development of a 2.7 cm left renal calculus with
suggestion of associated focal calyceal dilatation within the
inferior polar and interpolar regions of the left kidney. Several
other nonobstructive left nephrolithiasis measuring up to 3 mm.
2. An indeterminate 1.1 cm right renal fluid density lesion with
suggestion of an associated nodularity. Recommend MRI renal protocol
for further evaluation.
3.  Aortic Atherosclerosis ([id number]).

These results will be called to the ordering clinician or
representative by the Radiologist Assistant, and communication
documented in the PACS or [REDACTED].

## 2022-04-02 IMAGING — US IR URETURAL STENT LEFT NEW ACCESS W/O SEP NEPHROSTOMY CATH
1 series · 1 of 1 positions shown · non-contrast
Comparison: 12/31/2019

INDICATION: 59 year old male with left nephrolithiasis presenting for
nephrostomy access prior to lithotripsy.

EXAM:
1. Ultrasound and fluoroscopic guided percutaneous nephrostomy
access
2. Placement of left nephroureteral catheter

[Series 1: (id) · 1 of 1 slices shown]
[im 1/1]
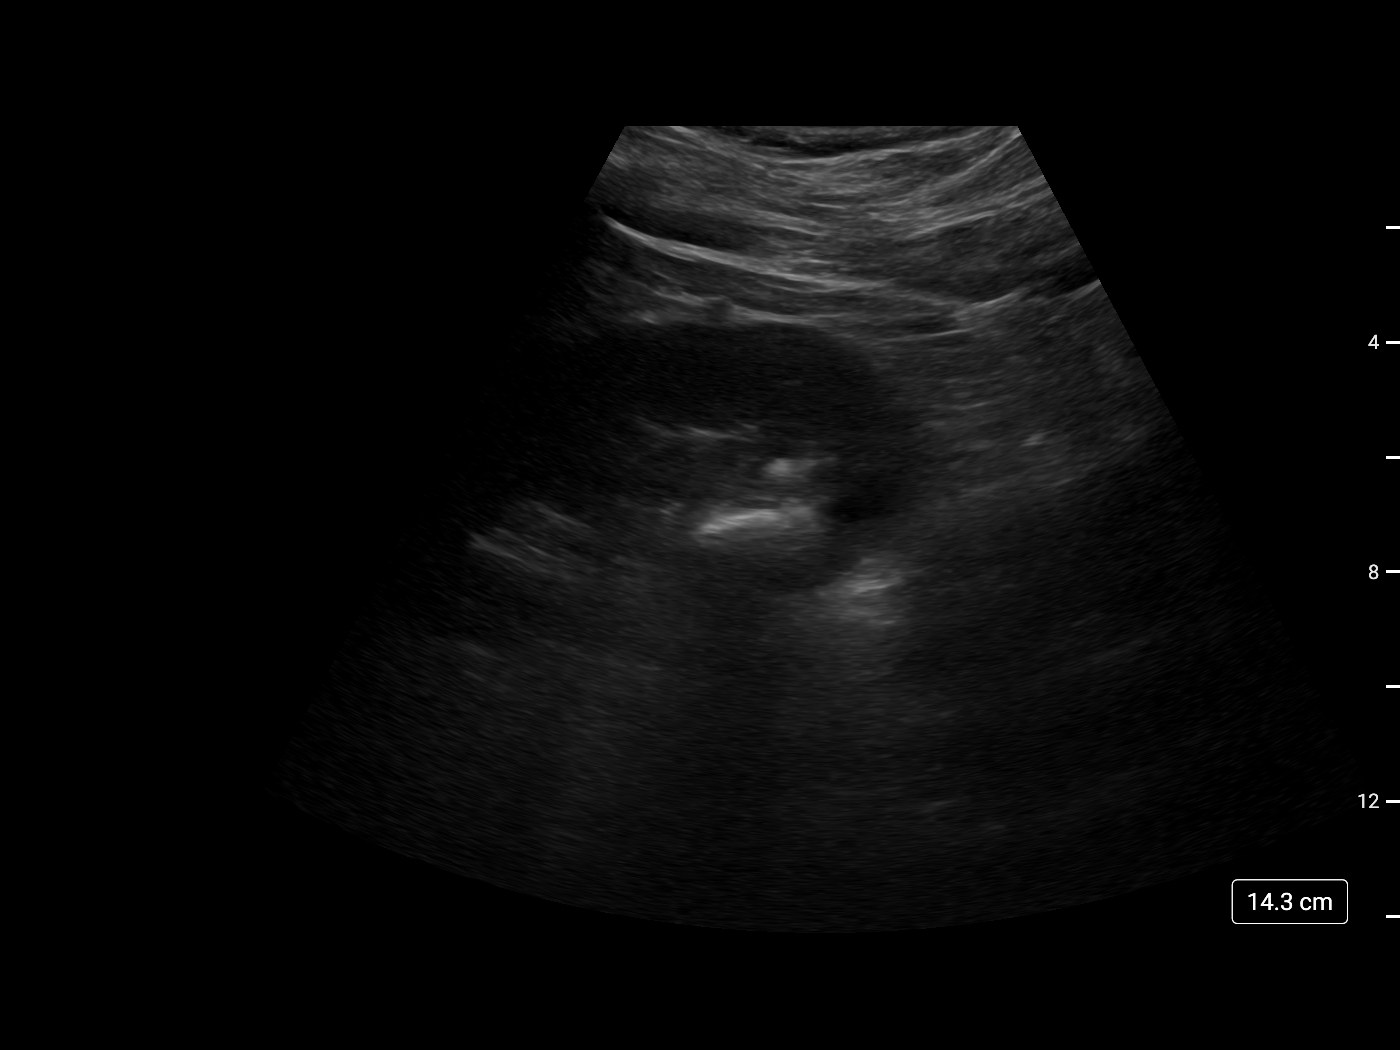

[1 of 1 positions shown; findings below may reference images not displayed]

MEDICATIONS:
Ceftriaxone, 2 g, intravenous; The antibiotic was administered in an
appropriate time frame prior to skin puncture.

ANESTHESIA/SEDATION:
Fentanyl 100 mcg IV; Versed 4 mg IV

Moderate Sedation Time:  15

The patient was continuously monitored during the procedure by the
interventional radiology nurse under my direct supervision.

CONTRAST:  10 mL Omnipaque 300-administered into the collecting
system(s)

FLUOROSCOPY TIME:  Fluoroscopy Time: 2 minutes 30 seconds (17 mGy).

COMPLICATIONS:
None immediate.

PROCEDURE:
Informed written consent was obtained from the patient after a
thorough discussion of the procedural risks, benefits and
alternatives. All questions were addressed. Maximal Sterile Barrier
Technique was utilized including caps, mask, sterile gowns, sterile
gloves, sterile drape, hand hygiene and skin antiseptic. A timeout
was performed prior to the initiation of the procedure.

Preprocedure ultrasound demonstrated minimal hydronephrosis with
shadowing nephrolithiasis in the left lower pole. Procedure was
planned. Subdermal Local anesthesia was provided with 1% lidocaine
at the planned needle entry site. A small skin nick was made. Deeper
local anesthetic was provided along the renal capsule with direct
ultrasound visualization. A 21 gauge, 20 cm Chiba needle was then
directed under direct ultrasound visualization into a mildly dilated
left lower pole calyx. The stylet was removed and there was
immediate efflux of clear urine. A Nitrex wire was navigated around
the indwelling left lower pole calculus in into the left ureter. The
6 French micro puncture and transition kit was then inserted.
Limited hand injection of contrast demonstrated adequate position
within the renal pelvis. Through the outer sheath, a Bentson wire
was then directed into the urinary bladder. The sheath was removed
and a 5 French angled tip catheter was inserted with the tip in the
urinary bladder. Hand injection of contrast demonstrated appropriate
position within the urinary bladder. The catheter was sutured at the
skin entry site with a 0 silk retention suture. The catheter was
capped and a sterile bandage was applied. The patient tolerated
procedure well was transferred back to the recovery room in
anticipation of forthcoming lithotripsy.
IMPRESSION: Technically successful left inferior pole percutaneous nephrostomy
access with placement of a nephroureteral safety catheter with the
distal tip located in the urinary bladder for planned lithotripsy
later the same day.

## 2022-08-19 ENCOUNTER — Other Ambulatory Visit: Payer: Self-pay

## 2022-08-19 ENCOUNTER — Ambulatory Visit (HOSPITAL_COMMUNITY)
Admission: RE | Admit: 2022-08-19 | Discharge: 2022-08-19 | Disposition: A | Discharge status: Home / Self Care | Source: Ambulatory Visit | Attending: Family Medicine | Admitting: Family Medicine

## 2022-08-19 ENCOUNTER — Encounter (HOSPITAL_COMMUNITY): Payer: Self-pay

## 2022-08-19 ENCOUNTER — Ambulatory Visit (INDEPENDENT_AMBULATORY_CARE_PROVIDER_SITE_OTHER)

## 2022-08-19 VITALS — BP 124/77 | HR 99 | Temp 98.3°F | Resp 18

## 2022-08-19 DIAGNOSIS — M25572 Pain in left ankle and joints of left foot: Secondary | ICD-10-CM

## 2022-08-19 DIAGNOSIS — S82892A Other fracture of left lower leg, initial encounter for closed fracture: Secondary | ICD-10-CM | POA: Diagnosis not present

## 2022-08-19 NOTE — ED Notes (Signed)
Asked about patient's last tetanus.  Patient stated he did not know, but would not be getting any shots today.  Notified dr hagler.
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
## 2022-08-19 NOTE — ED Notes (Signed)
Provided ice pack for comfort
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
## 2022-08-21 ENCOUNTER — Ambulatory Visit: Admitting: Podiatry

## 2022-08-21 DIAGNOSIS — S82842A Displaced bimalleolar fracture of left lower leg, initial encounter for closed fracture: Secondary | ICD-10-CM | POA: Diagnosis not present
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
## 2022-09-18 ENCOUNTER — Ambulatory Visit (INDEPENDENT_AMBULATORY_CARE_PROVIDER_SITE_OTHER)

## 2022-09-18 ENCOUNTER — Ambulatory Visit: Admitting: Podiatry

## 2022-09-18 DIAGNOSIS — S82842A Displaced bimalleolar fracture of left lower leg, initial encounter for closed fracture: Secondary | ICD-10-CM | POA: Diagnosis not present

## 2022-09-18 NOTE — Progress Notes (Signed)
  Subjective:  Patient ID: Jeremy Moore, male    DOB: 08-14-1960,  MRN: [id number]  Chief Complaint  Patient presents with   Fracture    62 y.o. male presents with the above complaint.  Patient presents with fracture of bimalleolar origin.  He states is doing a lot better no pain he has been nonweightbearing with posterior splint.  Would like to discuss next treatment plan.   Review of Systems: Negative except as noted in the HPI. Denies N/V/F/Ch.  Past Medical History:  Diagnosis Date   Diabetes mellitus without complication (HCC)    type 2    History of kidney stones    Hypertension    Pneumonia    hx of     Current Outpatient Medications:    amLODipine-benazepril (LOTREL) 5-20 MG capsule, Take 1 capsule by mouth daily., Disp: , Rfl:    JARDIANCE 25 MG TABS tablet, Take 25 mg by mouth daily., Disp: , Rfl:    RYBELSUS 14 MG TABS, Take 1 tablet by mouth daily., Disp: , Rfl:   Social History   Tobacco Use  Smoking Status Never  Smokeless Tobacco Never    No Known Allergies Objective:  There were no vitals filed for this visit. There is no height or weight on file to calculate BMI. Constitutional Well developed. Well nourished.  Vascular Dorsalis pedis pulses palpable bilaterally. Posterior tibial pulses palpable bilaterally. Capillary refill normal to all digits.  No cyanosis or clubbing noted. Pedal hair growth normal.  Neurologic Normal speech. Oriented to person, place, and time. Epicritic sensation to light touch grossly present bilaterally.  Dermatologic Nails well groomed and normal in appearance. No open wounds. No skin lesions.  Orthopedic: No further pain on palpation to the medial and lateral part of the ankle no further pain with range of motion of the ankle joint.  No high ankle pain noted.  No ecchymosis noted.  Mild swelling noted   Radiographs: IMPRESSION: 1. Acute avulsion fracture of the tip of the medial malleolus. 2. Probable acute tiny  avulsion fracture at the posterior malleolus. Assessment:   1. Bimalleolar ankle fracture, left, closed, initial encounter    Plan:  Patient was evaluated and treated and all questions answered.  Left ankle fracture bimalleolar minimally displaced with congruent ankle joint -All questions and concerns were discussed with the patient in extensive detail -I discussed with the patient that the fractures are very small fragments and likely ligamentous in nature.  There is no increase in medial or lateral gutter space.  The ankle joint is very congruent therefore I believe patient would benefit from conservative care as it is less than 4 mm of displacement. -Patient can begin weightbearing as tolerated in cam boot for next 2 weeks and then going to regular shoes.  If any foot and ankle issues arise in future he will come back and see me clinically his pain has resolved. -I discussed with him in the future he will develop arthritis for which we will discuss surgical and if indicated he states understanding
